# Patient Record
Sex: Female | Born: 1985 | Race: White | Hispanic: No | Marital: Married | Smoking: Never smoker
Health system: Southern US, Community
[De-identification: ages and names within clinical notes are randomized; demographics above are authoritative.]

## PROBLEM LIST (undated history)

## (undated) DIAGNOSIS — N912 Amenorrhea, unspecified: Secondary | ICD-10-CM

## (undated) DIAGNOSIS — Z87440 Personal history of urinary (tract) infections: Secondary | ICD-10-CM

## (undated) HISTORY — DX: Personal history of urinary (tract) infections: Z87.440

## (undated) HISTORY — DX: Amenorrhea, unspecified: N91.2

## (undated) HISTORY — PX: BREAST ENHANCEMENT SURGERY: SHX7

---

## 2008-01-30 ENCOUNTER — Ambulatory Visit: Payer: Self-pay | Admitting: Family Medicine

## 2009-03-17 ENCOUNTER — Observation Stay: Payer: Self-pay | Admitting: Obstetrics and Gynecology

## 2009-03-30 ENCOUNTER — Observation Stay: Payer: Self-pay

## 2009-03-31 ENCOUNTER — Inpatient Hospital Stay: Payer: Self-pay | Admitting: Obstetrics & Gynecology

## 2014-09-12 ENCOUNTER — Telehealth: Payer: Self-pay | Admitting: Obstetrics and Gynecology

## 2014-09-12 ENCOUNTER — Other Ambulatory Visit: Payer: Self-pay | Admitting: *Deleted

## 2014-09-12 MED ORDER — NORGESTIM-ETH ESTRAD TRIPHASIC 0.18/0.215/0.25 MG-35 MCG PO TABS: 1.0000 | ORAL_TABLET | Freq: Every day | ORAL | Status: DC

## 2014-09-12 NOTE — Telephone Encounter (Signed)
Patient called requesting a refill on her birth control.  She is scheduled for her annual 09/28/14. [facility] [place]. Thanks

## 2014-09-12 NOTE — Telephone Encounter (Signed)
Refilled for pt  

## 2014-09-26 ENCOUNTER — Encounter: Payer: Self-pay | Admitting: *Deleted

## 2014-09-28 ENCOUNTER — Other Ambulatory Visit: Payer: Self-pay | Admitting: Obstetrics and Gynecology

## 2014-09-28 ENCOUNTER — Encounter: Payer: Self-pay | Admitting: Obstetrics and Gynecology

## 2014-09-28 ENCOUNTER — Ambulatory Visit (INDEPENDENT_AMBULATORY_CARE_PROVIDER_SITE_OTHER): Payer: BLUE CROSS/BLUE SHIELD | Admitting: Obstetrics and Gynecology

## 2014-09-28 VITALS — BP 128/90 | HR 106 | Ht 62.0 in | Wt 123.0 lb

## 2014-09-28 DIAGNOSIS — Z01419 Encounter for gynecological examination (general) (routine) without abnormal findings: Secondary | ICD-10-CM | POA: Diagnosis not present

## 2014-09-28 MED ORDER — NORGESTIM-ETH ESTRAD TRIPHASIC 0.18/0.215/0.25 MG-35 MCG PO TABS
1.0000 | ORAL_TABLET | Freq: Every day | ORAL | Status: DC
Start: 2014-09-28 — End: 2016-02-28

## 2014-09-28 NOTE — Progress Notes (Signed)
  Subjective:     Jenny Mcguire is a 29 y.o. female and is here for a comprehensive physical exam. The patient reports no problems.  Social History   Social History  . Marital Status: Married    Spouse Name: N/A  . Number of Children: N/A  . Years of Education: N/A   Occupational History  . Not on file.   Social History Main Topics  . Smoking status: Never Smoker   . Smokeless tobacco: Never Used  . Alcohol Use: Yes     Comment: occas  . Drug Use: No  . Sexual Activity: Yes    Birth Control/ Protection: Pill   Other Topics Concern  . Not on file   Social History Narrative   Health Maintenance  Topic Date Due  . HIV Screening  11/26/2000  . TETANUS/TDAP  11/26/2004  . INFLUENZA VACCINE  08/29/2014    The following portions of the patient's history were reviewed and updated as appropriate: allergies, current medications, past family history, past medical history, past social history, past surgical history and problem list.  Review of Systems A comprehensive review of systems was negative.   Objective:    General appearance: alert, cooperative and appears stated age Neck: no adenopathy, no carotid bruit, no JVD, supple, symmetrical, trachea midline and thyroid not enlarged, symmetric, no tenderness/mass/nodules Lungs: clear to auscultation bilaterally Breasts: normal appearance, no masses or tenderness, bilateral implants without defect Heart: regular rate and rhythm, S1, S2 normal, no murmur, click, rub or gallop Abdomen: soft, non-tender; bowel sounds normal; no masses,  no organomegaly Pelvic: cervix normal in appearance, external genitalia normal, no adnexal masses or tenderness, no cervical motion tenderness, rectovaginal septum normal, uterus normal size, shape, and consistency and vagina normal without discharge    Assessment:    Healthy female exam. OCP user      Plan:  Pap obtained, will continue on current OCP   See After Visit Summary for  Counseling Recommendations

## 2014-09-28 NOTE — Patient Instructions (Signed)
  Place annual gynecologic exam patient instructions here.  Thank you for enrolling in MyChart. Please follow the instructions below to securely access your online medical record. MyChart allows you to send messages to your doctor, view your test results, manage appointments, and more.   How Do I Sign Up? 1. In your Internet browser, go to [employer] and enter [email or web address]. 2. Click on the Sign Up Now link in the Sign In box. You will see the New Member Sign Up page. 3. Enter your MyChart Access Code exactly as it appears below. You will not need to use this code after you've completed the sign-up process. If you do not sign up before the expiration date, you must request a new code.  MyChart Access Code: [email or web address] Expires: 11/27/2014 10:47 AM  4. Enter your Social Security Number ([id number]) and Date of Birth (mm/dd/yyyy) as indicated and click Submit. You will be taken to the next sign-up page. 5. Create a MyChart ID. This will be your MyChart login ID and cannot be changed, so think of one that is secure and easy to remember. 6. Create a MyChart password. You can change your password at any time. 7. Enter your Password Reset Question and Answer. This can be used at a later time if you forget your password.  8. Enter your e-mail address. You will receive e-mail notification when new information is available in MyChart. 9. Click Sign Up. You can now view your medical record.   Additional Information Remember, MyChart is NOT to be used for urgent needs. For medical emergencies, dial 911.

## 2014-09-29 LAB — CYTOLOGY - PAP

## 2014-10-04 ENCOUNTER — Telehealth: Payer: Self-pay | Admitting: *Deleted

## 2014-10-04 NOTE — Telephone Encounter (Signed)
Notified pt of normal results 

## 2014-10-04 NOTE — Telephone Encounter (Signed)
-----   Message from Ulyses Amor, [place] sent at 09/29/2014  3:57 PM EDT ----- Please let her know her pap was negative

## 2015-06-27 ENCOUNTER — Telehealth: Payer: Self-pay | Admitting: Obstetrics and Gynecology

## 2015-06-27 ENCOUNTER — Other Ambulatory Visit: Payer: Self-pay | Admitting: *Deleted

## 2015-06-27 MED ORDER — CIPROFLOXACIN HCL 500 MG PO TABS: 500.0000 mg | ORAL_TABLET | Freq: Two times a day (BID) | ORAL | Status: DC

## 2015-06-27 NOTE — Telephone Encounter (Signed)
Called pt she will drop off ua

## 2015-06-27 NOTE — Telephone Encounter (Signed)
Patient is positive that she has a UTI. Can something be called in for her or does she need to be seen? [facility] uses [facility] in [place]. Thanks

## 2015-10-03 ENCOUNTER — Encounter: Payer: BLUE CROSS/BLUE SHIELD | Admitting: Obstetrics and Gynecology

## 2015-10-06 ENCOUNTER — Encounter: Payer: BLUE CROSS/BLUE SHIELD | Admitting: Obstetrics and Gynecology

## 2015-10-11 ENCOUNTER — Encounter: Payer: Self-pay | Admitting: Obstetrics and Gynecology

## 2015-10-11 ENCOUNTER — Ambulatory Visit (INDEPENDENT_AMBULATORY_CARE_PROVIDER_SITE_OTHER): Payer: BLUE CROSS/BLUE SHIELD | Admitting: Obstetrics and Gynecology

## 2015-10-11 VITALS — BP 128/84 | HR 92 | Ht 62.0 in | Wt 123.6 lb

## 2015-10-11 DIAGNOSIS — N921 Excessive and frequent menstruation with irregular cycle: Secondary | ICD-10-CM | POA: Diagnosis not present

## 2015-10-11 DIAGNOSIS — N926 Irregular menstruation, unspecified: Secondary | ICD-10-CM

## 2015-10-11 DIAGNOSIS — Z01419 Encounter for gynecological examination (general) (routine) without abnormal findings: Secondary | ICD-10-CM | POA: Diagnosis not present

## 2015-10-11 LAB — POCT URINE PREGNANCY: Preg Test, Ur: NEGATIVE

## 2015-10-11 NOTE — Progress Notes (Signed)
   Subjective:     Jenny BergerLindsay R Mcguire is a 30 y.o. female and is here for a comprehensive physical exam. The patient reports BTB pretty heavy for 5 days after missing one pill this past month..  Social History   Social History  . Marital status: Married    Spouse name: N/A  . Number of children: N/A  . Years of education: N/A   Occupational History  . Not on file.   Social History Main Topics  . Smoking status: Never Smoker  . Smokeless tobacco: Never Used  . Alcohol use Yes     Comment: occas  . Drug use: No  . Sexual activity: Yes    Birth control/ protection: Pill   Other Topics Concern  . Not on file   Social History Narrative  . No narrative on file   Health Maintenance  Topic Date Due  . HIV Screening  11/26/2000  . TETANUS/TDAP  11/26/2004  . INFLUENZA VACCINE  08/29/2015  . PAP SMEAR  09/27/2017    The following portions of the patient's history were reviewed and updated as appropriate: allergies, current medications, past family history, past medical history, past social history, past surgical history and problem list.  Review of Systems Pertinent items noted in HPI and remainder of comprehensive ROS otherwise negative.   Objective:    General appearance: alert, cooperative and appears stated age Neck: no adenopathy, no carotid bruit, no JVD, supple, symmetrical, trachea midline and thyroid not enlarged, symmetric, no tenderness/mass/nodules Lungs: clear to auscultation bilaterally Breasts: normal appearance, no masses or tenderness Heart: regular rate and rhythm, S1, S2 normal, no murmur, click, rub or gallop Abdomen: soft, non-tender; bowel sounds normal; no masses,  no organomegaly Pelvic: cervix normal in appearance, external genitalia normal, no adnexal masses or tenderness, no cervical motion tenderness, rectovaginal septum normal, uterus normal size, shape, and consistency and vagina normal without discharge Extremities: extremities normal,  atraumatic, no cyanosis or edema   bilateral breast implants without defect noted UPT- Assessment:    Healthy female exam. BTB on OCP     Plan:  Reassured, desires pregnancy within next year, to add OTC PNV now. No other labs indicated.   See After Visit Summary for Counseling Recommendations

## 2015-10-11 NOTE — Patient Instructions (Signed)
Preventive Care for Adults, Female A healthy lifestyle and preventive care can promote health and wellness. Preventive health guidelines for women include the following key practices.  A routine yearly physical is a good way to check with your health care provider about your health and preventive screening. It is a chance to share any concerns and updates on your health and to receive a thorough exam.  Visit your dentist for a routine exam and preventive care every 6 months. Brush your teeth twice a day and floss once a day. Good oral hygiene prevents tooth decay and gum disease.  The frequency of eye exams is based on your age, health, family medical history, use of contact lenses, and other factors. Follow your health care provider's recommendations for frequency of eye exams.  Eat a healthy diet. Foods like vegetables, fruits, whole grains, low-fat dairy products, and lean protein foods contain the nutrients you need without too many calories. Decrease your intake of foods high in solid fats, added sugars, and salt. Eat the right amount of calories for you.Get information about a proper diet from your health care provider, if necessary.  Regular physical exercise is one of the most important things you can do for your health. Most adults should get at least 150 minutes of moderate-intensity exercise (any activity that increases your heart rate and causes you to sweat) each week. In addition, most adults need muscle-strengthening exercises on 2 or more days a week.  Maintain a healthy weight. The body mass index (BMI) is a screening tool to identify possible weight problems. It provides an estimate of body fat based on height and weight. Your health care provider can find your BMI and can help you achieve or maintain a healthy weight.For adults 20 years and older:  A BMI below 18.5 is considered underweight.  A BMI of 18.5 to 24.9 is normal.  A BMI of 25 to 29.9 is considered  overweight.  A BMI of 30 and above is considered obese.  Maintain normal blood lipids and cholesterol levels by exercising and minimizing your intake of saturated fat. Eat a balanced diet with plenty of fruit and vegetables. Blood tests for lipids and cholesterol should begin at age 64 and be repeated every 5 years. If your lipid or cholesterol levels are high, you are over 50, or you are at high risk for heart disease, you may need your cholesterol levels checked more frequently.Ongoing high lipid and cholesterol levels should be treated with medicines if diet and exercise are not working.  If you smoke, find out from your health care provider how to quit. If you do not use tobacco, do not start.  Lung cancer screening is recommended for adults aged 52-80 years who are at high risk for developing lung cancer because of a history of smoking. A yearly low-dose CT scan of the lungs is recommended for people who have at least a 30-pack-year history of smoking and are a current smoker or have quit within the past 15 years. A pack year of smoking is smoking an average of 1 pack of cigarettes a day for 1 year (for example: 1 pack a day for 30 years or 2 packs a day for 15 years). Yearly screening should continue until the smoker has stopped smoking for at least 15 years. Yearly screening should be stopped for people who develop a health problem that would prevent them from having lung cancer treatment.  If you are pregnant, do not drink alcohol. If you are  breastfeeding, be very cautious about drinking alcohol. If you are not pregnant and choose to drink alcohol, do not have more than 1 drink per day. One drink is considered to be 12 ounces (355 mL) of beer, 5 ounces (148 mL) of wine, or 1.5 ounces (44 mL) of liquor.  Avoid use of street drugs. Do not share needles with anyone. Ask for help if you need support or instructions about stopping the use of drugs.  High blood pressure causes heart disease and  increases the risk of stroke. Your blood pressure should be checked at least every 1 to 2 years. Ongoing high blood pressure should be treated with medicines if weight loss and exercise do not work.  If you are 25-78 years old, ask your health care provider if you should take aspirin to prevent strokes.  Diabetes screening is done by taking a blood sample to check your blood glucose level after you have not eaten for a certain period of time (fasting). If you are not overweight and you do not have risk factors for diabetes, you should be screened once every 3 years starting at age 86. If you are overweight or obese and you are 3-87 years of age, you should be screened for diabetes every year as part of your cardiovascular risk assessment.  Breast cancer screening is essential preventive care for women. You should practice "breast self-awareness." This means understanding the normal appearance and feel of your breasts and may include breast self-examination. Any changes detected, no matter how small, should be reported to a health care provider. Women in their 66s and 30s should have a clinical breast exam (CBE) by a health care provider as part of a regular health exam every 1 to 3 years. After age 43, women should have a CBE every year. Starting at age 37, women should consider having a mammogram (breast X-ray test) every year. Women who have a family history of breast cancer should talk to their health care provider about genetic screening. Women at a high risk of breast cancer should talk to their health care providers about having an MRI and a mammogram every year.  Breast cancer gene (BRCA)-related cancer risk assessment is recommended for women who have family members with BRCA-related cancers. BRCA-related cancers include breast, ovarian, tubal, and peritoneal cancers. Having family members with these cancers may be associated with an increased risk for harmful changes (mutations) in the breast  cancer genes BRCA1 and BRCA2. Results of the assessment will determine the need for genetic counseling and BRCA1 and BRCA2 testing.  Your health care provider may recommend that you be screened regularly for cancer of the pelvic organs (ovaries, uterus, and vagina). This screening involves a pelvic examination, including checking for microscopic changes to the surface of your cervix (Pap test). You may be encouraged to have this screening done every 3 years, beginning at age 78.  For women ages 79-65, health care providers may recommend pelvic exams and Pap testing every 3 years, or they may recommend the Pap and pelvic exam, combined with testing for human papilloma virus (HPV), every 5 years. Some types of HPV increase your risk of cervical cancer. Testing for HPV may also be done on women of any age with unclear Pap test results.  Other health care providers may not recommend any screening for nonpregnant women who are considered low risk for pelvic cancer and who do not have symptoms. Ask your health care provider if a screening pelvic exam is right for  you.  If you have had past treatment for cervical cancer or a condition that could lead to cancer, you need Pap tests and screening for cancer for at least 20 years after your treatment. If Pap tests have been discontinued, your risk factors (such as having a new sexual partner) need to be reassessed to determine if screening should resume. Some women have medical problems that increase the chance of getting cervical cancer. In these cases, your health care provider may recommend more frequent screening and Pap tests.  Colorectal cancer can be detected and often prevented. Most routine colorectal cancer screening begins at the age of 50 years and continues through age 75 years. However, your health care provider may recommend screening at an earlier age if you have risk factors for colon cancer. On a yearly basis, your health care provider may provide  home test kits to check for hidden blood in the stool. Use of a small camera at the end of a tube, to directly examine the colon (sigmoidoscopy or colonoscopy), can detect the earliest forms of colorectal cancer. Talk to your health care provider about this at age 50, when routine screening begins. Direct exam of the colon should be repeated every 5-10 years through age 75 years, unless early forms of precancerous polyps or small growths are found.  People who are at an increased risk for hepatitis B should be screened for this virus. You are considered at high risk for hepatitis B if:  You were born in a country where hepatitis B occurs often. Talk with your health care provider about which countries are considered high risk.  Your parents were born in a high-risk country and you have not received a shot to protect against hepatitis B (hepatitis B vaccine).  You have HIV or AIDS.  You use needles to inject street drugs.  You live with, or have sex with, someone who has hepatitis B.  You get hemodialysis treatment.  You take certain medicines for conditions like cancer, organ transplantation, and autoimmune conditions.  Hepatitis C blood testing is recommended for all people born from 1945 through 1965 and any individual with known risks for hepatitis C.  Practice safe sex. Use condoms and avoid high-risk sexual practices to reduce the spread of sexually transmitted infections (STIs). STIs include gonorrhea, chlamydia, syphilis, trichomonas, herpes, HPV, and human immunodeficiency virus (HIV). Herpes, HIV, and HPV are viral illnesses that have no cure. They can result in disability, cancer, and death.  You should be screened for sexually transmitted illnesses (STIs) including gonorrhea and chlamydia if:  You are sexually active and are younger than 24 years.  You are older than 24 years and your health care provider tells you that you are at risk for this type of infection.  Your sexual  activity has changed since you were last screened and you are at an increased risk for chlamydia or gonorrhea. Ask your health care provider if you are at risk.  If you are at risk of being infected with HIV, it is recommended that you take a prescription medicine daily to prevent HIV infection. This is called preexposure prophylaxis (PrEP). You are considered at risk if:  You are sexually active and do not regularly use condoms or know the HIV status of your partner(s).  You take drugs by injection.  You are sexually active with a partner who has HIV.  Talk with your health care provider about whether you are at high risk of being infected with HIV. If   you choose to begin PrEP, you should first be tested for HIV. You should then be tested every 3 months for as long as you are taking PrEP.  Osteoporosis is a disease in which the bones lose minerals and strength with aging. This can result in serious bone fractures or breaks. The risk of osteoporosis can be identified using a bone density scan. Women ages 1 years and over and women at risk for fractures or osteoporosis should discuss screening with their health care providers. Ask your health care provider whether you should take a calcium supplement or vitamin D to reduce the rate of osteoporosis.  Menopause can be associated with physical symptoms and risks. Hormone replacement therapy is available to decrease symptoms and risks. You should talk to your health care provider about whether hormone replacement therapy is right for you.  Use sunscreen. Apply sunscreen liberally and repeatedly throughout the day. You should seek shade when your shadow is shorter than you. Protect yourself by wearing long sleeves, pants, a wide-brimmed hat, and sunglasses year round, whenever you are outdoors.  Once a month, do a whole body skin exam, using a mirror to look at the skin on your back. Tell your health care provider of new moles, moles that have irregular  borders, moles that are larger than a pencil eraser, or moles that have changed in shape or color.  Stay current with required vaccines (immunizations).  Influenza vaccine. All adults should be immunized every year.  Tetanus, diphtheria, and acellular pertussis (Td, Tdap) vaccine. Pregnant women should receive 1 dose of Tdap vaccine during each pregnancy. The dose should be obtained regardless of the length of time since the last dose. Immunization is preferred during the 27th-36th week of gestation. An adult who has not previously received Tdap or who does not know her vaccine status should receive 1 dose of Tdap. This initial dose should be followed by tetanus and diphtheria toxoids (Td) booster doses every 10 years. Adults with an unknown or incomplete history of completing a 3-dose immunization series with Td-containing vaccines should begin or complete a primary immunization series including a Tdap dose. Adults should receive a Td booster every 10 years.  Varicella vaccine. An adult without evidence of immunity to varicella should receive 2 doses or a second dose if she has previously received 1 dose. Pregnant females who do not have evidence of immunity should receive the first dose after pregnancy. This first dose should be obtained before leaving the health care facility. The second dose should be obtained 4-8 weeks after the first dose.  Human papillomavirus (HPV) vaccine. Females aged 13-26 years who have not received the vaccine previously should obtain the 3-dose series. The vaccine is not recommended for use in pregnant females. However, pregnancy testing is not needed before receiving a dose. If a female is found to be pregnant after receiving a dose, no treatment is needed. In that case, the remaining doses should be delayed until after the pregnancy. Immunization is recommended for any person with an immunocompromised condition through the age of 24 years if she did not get any or all doses  earlier. During the 3-dose series, the second dose should be obtained 4-8 weeks after the first dose. The third dose should be obtained 24 weeks after the first dose and 16 weeks after the second dose.  Zoster vaccine. One dose is recommended for adults aged 97 years or older unless certain conditions are present.  Measles, mumps, and rubella (MMR) vaccine. Adults born  before 1957 generally are considered immune to measles and mumps. Adults born in [birth year] or later should have 1 or more doses of MMR vaccine unless there is a contraindication to the vaccine or there is laboratory evidence of immunity to each of the three diseases. A routine second dose of MMR vaccine should be obtained at least 28 days after the first dose for students attending postsecondary schools, health care workers, or international travelers. People who received inactivated measles vaccine or an unknown type of measles vaccine during 1963-1967 should receive 2 doses of MMR vaccine. People who received inactivated mumps vaccine or an unknown type of mumps vaccine before 1979 and are at high risk for mumps infection should consider immunization with 2 doses of MMR vaccine. For females of childbearing age, rubella immunity should be determined. If there is no evidence of immunity, females who are not pregnant should be vaccinated. If there is no evidence of immunity, females who are pregnant should delay immunization until after pregnancy. Unvaccinated health care workers born before 60 who lack laboratory evidence of measles, mumps, or rubella immunity or laboratory confirmation of disease should consider measles and mumps immunization with 2 doses of MMR vaccine or rubella immunization with 1 dose of MMR vaccine.  Pneumococcal 13-valent conjugate (PCV13) vaccine. When indicated, a person who is uncertain of his immunization history and has no record of immunization should receive the PCV13 vaccine. All adults 61 years of age and older  should receive this vaccine. An adult aged 92 years or older who has certain medical conditions and has not been previously immunized should receive 1 dose of PCV13 vaccine. This PCV13 should be followed with a dose of pneumococcal polysaccharide (PPSV23) vaccine. Adults who are at high risk for pneumococcal disease should obtain the PPSV23 vaccine at least 8 weeks after the dose of PCV13 vaccine. Adults older than 30 years of age who have normal immune system function should obtain the PPSV23 vaccine dose at least 1 year after the dose of PCV13 vaccine.  Pneumococcal polysaccharide (PPSV23) vaccine. When PCV13 is also indicated, PCV13 should be obtained first. All adults aged 2 years and older should be immunized. An adult younger than age 30 years who has certain medical conditions should be immunized. Any person who resides in a nursing home or long-term care facility should be immunized. An adult smoker should be immunized. People with an immunocompromised condition and certain other conditions should receive both PCV13 and PPSV23 vaccines. People with human immunodeficiency virus (HIV) infection should be immunized as soon as possible after diagnosis. Immunization during chemotherapy or radiation therapy should be avoided. Routine use of PPSV23 vaccine is not recommended for American Indians, [place] Natives, or people younger than 65 years unless there are medical conditions that require PPSV23 vaccine. When indicated, people who have unknown immunization and have no record of immunization should receive PPSV23 vaccine. One-time revaccination 5 years after the first dose of PPSV23 is recommended for people aged 19-64 years who have chronic kidney failure, nephrotic syndrome, asplenia, or immunocompromised conditions. People who received 1-2 doses of PPSV23 before age 44 years should receive another dose of PPSV23 vaccine at age 83 years or later if at least 5 years have passed since the previous dose. Doses  of PPSV23 are not needed for people immunized with PPSV23 at or after age 20 years.  Meningococcal vaccine. Adults with asplenia or persistent complement component deficiencies should receive 2 doses of quadrivalent meningococcal conjugate (MenACWY-D) vaccine. The doses should be obtained  at least 2 months apart. Microbiologists working with certain meningococcal bacteria, [place] recruits, people at risk during an outbreak, and people who travel to or live in countries with a high rate of meningitis should be immunized. A first-year college student up through age 28 years who is living in a residence hall should receive a dose if she did not receive a dose on or after her 16th birthday. Adults who have certain high-risk conditions should receive one or more doses of vaccine.  Hepatitis A vaccine. Adults who wish to be protected from this disease, have certain high-risk conditions, work with hepatitis A-infected animals, work in hepatitis A research labs, or travel to or work in countries with a high rate of hepatitis A should be immunized. Adults who were previously unvaccinated and who anticipate close contact with an international adoptee during the first 60 days after arrival in the [place] from a country with a high rate of hepatitis A should be immunized.  Hepatitis B vaccine. Adults who wish to be protected from this disease, have certain high-risk conditions, may be exposed to blood or other infectious body fluids, are household contacts or sex partners of hepatitis B positive people, are clients or workers in certain care facilities, or travel to or work in countries with a high rate of hepatitis B should be immunized.  Haemophilus influenzae type b (Hib) vaccine. A previously unvaccinated person with asplenia or sickle cell disease or having a scheduled splenectomy should receive 1 dose of Hib vaccine. Regardless of previous immunization, a recipient of a hematopoietic stem cell transplant  should receive a 3-dose series 6-12 months after her successful transplant. Hib vaccine is not recommended for adults with HIV infection. Preventive Services / Frequency Ages 71 to 87 years  Blood pressure check.** / Every 3-5 years.  Lipid and cholesterol check.** / Every 5 years beginning at age 1.  Clinical breast exam.** / Every 3 years for women in their 3s and 31s.  BRCA-related cancer risk assessment.** / For women who have family members with a BRCA-related cancer (breast, ovarian, tubal, or peritoneal cancers).  Pap test.** / Every 2 years from ages 50 through 86. Every 3 years starting at age 87 through age 7 or 75 with a history of 3 consecutive normal Pap tests.  HPV screening.** / Every 3 years from ages 59 through ages 35 to 6 with a history of 3 consecutive normal Pap tests.  Hepatitis C blood test.** / For any individual with known risks for hepatitis C.  Skin self-exam. / Monthly.  Influenza vaccine. / Every year.  Tetanus, diphtheria, and acellular pertussis (Tdap, Td) vaccine.** / Consult your health care provider. Pregnant women should receive 1 dose of Tdap vaccine during each pregnancy. 1 dose of Td every 10 years.  Varicella vaccine.** / Consult your health care provider. Pregnant females who do not have evidence of immunity should receive the first dose after pregnancy.  HPV vaccine. / 3 doses over 6 months, if 72 and younger. The vaccine is not recommended for use in pregnant females. However, pregnancy testing is not needed before receiving a dose.  Measles, mumps, rubella (MMR) vaccine.** / You need at least 1 dose of MMR if you were born in 1957 or later. You may also need a 2nd dose. For females of childbearing age, rubella immunity should be determined. If there is no evidence of immunity, females who are not pregnant should be vaccinated. If there is no evidence of immunity, females who are  pregnant should delay immunization until after  pregnancy.  Pneumococcal 13-valent conjugate (PCV13) vaccine.** / Consult your health care provider.  Pneumococcal polysaccharide (PPSV23) vaccine.** / 1 to 2 doses if you smoke cigarettes or if you have certain conditions.  Meningococcal vaccine.** / 1 dose if you are age 87 to 44 years and a Market researcher living in a residence hall, or have one of several medical conditions, you need to get vaccinated against meningococcal disease. You may also need additional booster doses.  Hepatitis A vaccine.** / Consult your health care provider.  Hepatitis B vaccine.** / Consult your health care provider.  Haemophilus influenzae type b (Hib) vaccine.** / Consult your health care provider. Ages 86 to 38 years  Blood pressure check.** / Every year.  Lipid and cholesterol check.** / Every 5 years beginning at age 49 years.  Lung cancer screening. / Every year if you are aged 71-80 years and have a 30-pack-year history of smoking and currently smoke or have quit within the past 15 years. Yearly screening is stopped once you have quit smoking for at least 15 years or develop a health problem that would prevent you from having lung cancer treatment.  Clinical breast exam.** / Every year after age 51 years.  BRCA-related cancer risk assessment.** / For women who have family members with a BRCA-related cancer (breast, ovarian, tubal, or peritoneal cancers).  Mammogram.** / Every year beginning at age 18 years and continuing for as long as you are in good health. Consult with your health care provider.  Pap test.** / Every 3 years starting at age 63 years through age 37 or 57 years with a history of 3 consecutive normal Pap tests.  HPV screening.** / Every 3 years from ages 41 years through ages 76 to 23 years with a history of 3 consecutive normal Pap tests.  Fecal occult blood test (FOBT) of stool. / Every year beginning at age 36 years and continuing until age 51 years. You may not need  to do this test if you get a colonoscopy every 10 years.  Flexible sigmoidoscopy or colonoscopy.** / Every 5 years for a flexible sigmoidoscopy or every 10 years for a colonoscopy beginning at age 36 years and continuing until age 35 years.  Hepatitis C blood test.** / For all people born from [birth year] through 1965 and any individual with known risks for hepatitis C.  Skin self-exam. / Monthly.  Influenza vaccine. / Every year.  Tetanus, diphtheria, and acellular pertussis (Tdap/Td) vaccine.** / Consult your health care provider. Pregnant women should receive 1 dose of Tdap vaccine during each pregnancy. 1 dose of Td every 10 years.  Varicella vaccine.** / Consult your health care provider. Pregnant females who do not have evidence of immunity should receive the first dose after pregnancy.  Zoster vaccine.** / 1 dose for adults aged 73 years or older.  Measles, mumps, rubella (MMR) vaccine.** / You need at least 1 dose of MMR if you were born in 1957 or later. You may also need a second dose. For females of childbearing age, rubella immunity should be determined. If there is no evidence of immunity, females who are not pregnant should be vaccinated. If there is no evidence of immunity, females who are pregnant should delay immunization until after pregnancy.  Pneumococcal 13-valent conjugate (PCV13) vaccine.** / Consult your health care provider.  Pneumococcal polysaccharide (PPSV23) vaccine.** / 1 to 2 doses if you smoke cigarettes or if you have certain conditions.  Meningococcal vaccine.** /  Consult your health care provider.  Hepatitis A vaccine.** / Consult your health care provider.  Hepatitis B vaccine.** / Consult your health care provider.  Haemophilus influenzae type b (Hib) vaccine.** / Consult your health care provider. Ages 80 years and over  Blood pressure check.** / Every year.  Lipid and cholesterol check.** / Every 5 years beginning at age 62 years.  Lung cancer  screening. / Every year if you are aged 32-80 years and have a 30-pack-year history of smoking and currently smoke or have quit within the past 15 years. Yearly screening is stopped once you have quit smoking for at least 15 years or develop a health problem that would prevent you from having lung cancer treatment.  Clinical breast exam.** / Every year after age 61 years.  BRCA-related cancer risk assessment.** / For women who have family members with a BRCA-related cancer (breast, ovarian, tubal, or peritoneal cancers).  Mammogram.** / Every year beginning at age 39 years and continuing for as long as you are in good health. Consult with your health care provider.  Pap test.** / Every 3 years starting at age 85 years through age 74 or 72 years with 3 consecutive normal Pap tests. Testing can be stopped between 65 and 70 years with 3 consecutive normal Pap tests and no abnormal Pap or HPV tests in the past 10 years.  HPV screening.** / Every 3 years from ages 55 years through ages 67 or 77 years with a history of 3 consecutive normal Pap tests. Testing can be stopped between 65 and 70 years with 3 consecutive normal Pap tests and no abnormal Pap or HPV tests in the past 10 years.  Fecal occult blood test (FOBT) of stool. / Every year beginning at age 81 years and continuing until age 22 years. You may not need to do this test if you get a colonoscopy every 10 years.  Flexible sigmoidoscopy or colonoscopy.** / Every 5 years for a flexible sigmoidoscopy or every 10 years for a colonoscopy beginning at age 67 years and continuing until age 22 years.  Hepatitis C blood test.** / For all people born from [birth year] through 1965 and any individual with known risks for hepatitis C.  Osteoporosis screening.** / A one-time screening for women ages 8 years and over and women at risk for fractures or osteoporosis.  Skin self-exam. / Monthly.  Influenza vaccine. / Every year.  Tetanus, diphtheria, and  acellular pertussis (Tdap/Td) vaccine.** / 1 dose of Td every 10 years.  Varicella vaccine.** / Consult your health care provider.  Zoster vaccine.** / 1 dose for adults aged 56 years or older.  Pneumococcal 13-valent conjugate (PCV13) vaccine.** / Consult your health care provider.  Pneumococcal polysaccharide (PPSV23) vaccine.** / 1 dose for all adults aged 15 years and older.  Meningococcal vaccine.** / Consult your health care provider.  Hepatitis A vaccine.** / Consult your health care provider.  Hepatitis B vaccine.** / Consult your health care provider.  Haemophilus influenzae type b (Hib) vaccine.** / Consult your health care provider. ** Family history and personal history of risk and conditions may change your health care provider's recommendations.   This information is not intended to replace advice given to you by your health care provider. Make sure you discuss any questions you have with your health care provider.   Document Released: 03/12/2001 Document Revised: 02/04/2014 Document Reviewed: 06/11/2010 Elsevier Interactive Patient Education [employer].

## 2015-10-16 ENCOUNTER — Encounter: Payer: Self-pay | Admitting: Obstetrics and Gynecology

## 2015-10-18 ENCOUNTER — Telehealth: Payer: Self-pay | Admitting: Obstetrics and Gynecology

## 2015-10-18 NOTE — Telephone Encounter (Signed)
Pt called yesterday and she said that she wanted to make sure you have gotten her My Chart messages, she wasn't sure if they were going thru or not. She stated she had never done the my chart message before and wanted to make sure she was doing it right.

## 2015-11-10 ENCOUNTER — Encounter: Payer: Self-pay | Admitting: Obstetrics and Gynecology

## 2015-11-14 ENCOUNTER — Encounter: Payer: Self-pay | Admitting: Obstetrics and Gynecology

## 2015-11-30 ENCOUNTER — Encounter: Payer: BLUE CROSS/BLUE SHIELD | Admitting: Obstetrics and Gynecology

## 2015-12-12 ENCOUNTER — Encounter: Payer: Self-pay | Admitting: Obstetrics and Gynecology

## 2015-12-13 ENCOUNTER — Encounter: Payer: Self-pay | Admitting: Obstetrics and Gynecology

## 2016-01-28 ENCOUNTER — Encounter: Payer: Self-pay | Admitting: Obstetrics and Gynecology

## 2016-01-29 NOTE — L&D Delivery Note (Signed)
      Delivery Note   Jenny BergerLindsay R Mcguire is a 31 y.o. G2P1001 at 5267w2d Estimated Date of Delivery: 10/11/16  PRE-OPERATIVE DIAGNOSIS:  1) 5067w2d pregnancy.   POST-OPERATIVE DIAGNOSIS:  1) 2467w2d pregnancy s/p Vaginal, Spontaneous Delivery   Delivery Type: Vaginal, Spontaneous Delivery    Delivery Clinician: Doreene BurkeHOMPSON, Jaley Yan   Delivery Anesthesia: Epidural   Labor Complications:   None    ESTIMATED BLOOD LOSS: 150 ml    FINDINGS:   1) female infant, Apgar scores of 8    at 1 minute and 8    at 5 minutes and a birthweight of    ounces.    2) Nuchal cord: No  SPECIMENS:   PLACENTA:   Appearance: Intact , 3 vessel cord, cord blood collected   Removal: Spontaneous      Disposition:   Help per protocol then discarded  DISPOSITION:  Infant to left in stable condition in the delivery room, with L&D personnel and mother,  NARRATIVE SUMMARY: Labor course:  Ms. Jenny Mcguire is a G2P1001 at 5867w2d who presented for labor management.  She progressed well in labor with pitocin.  She received the appropriate Epidural anesthesia and proceeded to complete dilation. She evidenced good maternal expulsive effort during the second stage. She went on to deliver a viable female infant "Jenny Mcguire". The placenta delivered without problems and was noted to be complete. A perineal and vaginal examination was performed. Episiotomy/Lacerations:    None. The patient tolerated this well.  Doreene Burkennie Shelsey Rieth, CNM 09/29/2016 1:25 PM

## 2016-02-01 ENCOUNTER — Encounter: Payer: Self-pay | Admitting: Obstetrics and Gynecology

## 2016-02-01 ENCOUNTER — Ambulatory Visit (INDEPENDENT_AMBULATORY_CARE_PROVIDER_SITE_OTHER): Payer: 59 | Admitting: Obstetrics and Gynecology

## 2016-02-01 VITALS — BP 120/81 | HR 101 | Ht 62.0 in | Wt 125.6 lb

## 2016-02-01 DIAGNOSIS — N926 Irregular menstruation, unspecified: Secondary | ICD-10-CM

## 2016-02-01 LAB — POCT URINE PREGNANCY: Preg Test, Ur: POSITIVE — AB

## 2016-02-01 NOTE — Progress Notes (Signed)
Subjective:     Patient ID: Jenny Mcguire, female   DOB: 1985/03/06, 31 y.o.   MRN: [id number]  HPI LMP 01/05/16, [facility] 6464[facility]6d[facility] Denies any symptoms. Happy about planned pregnancy  Review of Systems Negative except missed menses    Objective:   Physical Exam A&Ox4 Well groomed female in no distress Blood pressure 120/81, pulse (!) 101, height 5\' 2"  (1.575 m), weight 125 lb 9.6 oz (57 kg), last menstrual period 01/05/2016. UPT+    Assessment:     Missed menses    Plan:     Will return at 7 weeks for viability scan and nurse intake/labs Also New OB at 11 weeks.  Toni Demo [place], CNM

## 2016-02-02 ENCOUNTER — Encounter: Payer: Self-pay | Admitting: Obstetrics and Gynecology

## 2016-02-11 ENCOUNTER — Encounter: Payer: Self-pay | Admitting: Obstetrics and Gynecology

## 2016-02-21 ENCOUNTER — Encounter: Payer: Self-pay | Admitting: Obstetrics and Gynecology

## 2016-02-28 ENCOUNTER — Ambulatory Visit (INDEPENDENT_AMBULATORY_CARE_PROVIDER_SITE_OTHER): Payer: 59 | Admitting: Obstetrics and Gynecology

## 2016-02-28 ENCOUNTER — Ambulatory Visit (INDEPENDENT_AMBULATORY_CARE_PROVIDER_SITE_OTHER): Payer: 59

## 2016-02-28 VITALS — BP 104/79 | HR 88 | Ht 62.0 in | Wt 124.5 lb

## 2016-02-28 DIAGNOSIS — N926 Irregular menstruation, unspecified: Secondary | ICD-10-CM

## 2016-02-28 DIAGNOSIS — Z1389 Encounter for screening for other disorder: Secondary | ICD-10-CM

## 2016-02-28 DIAGNOSIS — Z113 Encounter for screening for infections with a predominantly sexual mode of transmission: Secondary | ICD-10-CM

## 2016-02-28 DIAGNOSIS — Z3481 Encounter for supervision of other normal pregnancy, first trimester: Secondary | ICD-10-CM

## 2016-02-28 LAB — OB RESULTS CONSOLE VARICELLA ZOSTER ANTIBODY, IGG: Varicella: IMMUNE

## 2016-02-28 NOTE — Progress Notes (Signed)
Jenny BergerLindsay R Mcguire presents for NOB nurse interview visit. Pregnancy confirmation done 02/01/2016. UPT: POSITIVE.  G-2  P-1001.  Ultrasound done today. GA: 7.6 wks, consistent with LMP: 01/05/2016. Pregnancy education material explained and given.  No cats in the home. NOB labs ordered.  HIV labs and Drug screen were explained optional and she did not decline. Drug screen ordered. PNV encouraged. Pt desires the [facility] genetic testing and will do on her NOB physical.  Pt. To follow up with provider on 03/20/2016 for NOB physical, which is already scheduled.   All questions answered.

## 2016-02-28 NOTE — Patient Instructions (Signed)
Pregnancy and Zika Virus Disease Introduction Zika virus disease, or Zika, is an illness that can spread to people from mosquitoes that carry the virus. It may also spread from person to person through infected body fluids. Zika first occurred in [place], but recently it has spread to new areas. The virus occurs in tropical climates. The location of Zika continues to change. Most people who become infected with Zika virus do not develop serious illness. However, Zika may cause birth defects in an unborn baby whose mother is infected with the virus. It may also increase the risk of miscarriage. What are the symptoms of Zika virus disease? In many cases, people who have been infected with Zika virus do not develop any symptoms. If symptoms appear, they usually start about a week after the person is infected. Symptoms are usually mild. They may include:  Fever.  Rash.  Red eyes.  Joint pain. How does Zika virus disease spread? The main way that Zika virus spreads is through the bite of a certain type of mosquito. Unlike most types of mosquitos, which bite only at night, the type of mosquito that carries Zika virus bites both at night and during the day. Zika virus can also spread through sexual contact, through a blood transfusion, and from a mother to her baby before or during birth. Once you have had Zika virus disease, it is unlikely that you will get it again. Can I pass Zika to my baby during pregnancy? Yes, Zika can pass from a mother to her baby before or during birth. What problems can Zika cause for my baby? A woman who is infected with Zika virus while pregnant is at risk of having her baby born with a condition in which the brain or head is smaller than expected (microcephaly). Babies who have microcephaly can have developmental delays, seizures, hearing problems, and vision problems. Having Zika virus disease during pregnancy can also increase the risk of miscarriage. How can Zika  virus disease be prevented? There is no vaccine to prevent Zika. The best way to prevent the disease is to avoid infected mosquitoes and avoid exposure to body fluids that can spread the virus. Avoid any possible exposure to Zika by taking the following precautions. For women and their sex partners:  Avoid traveling to high-risk areas. The locations where Zika is being reported change often. To identify high-risk areas, check the CDC travel website: www.cdc.gov/zika/geo/index.html  If you or your sex partner must travel to a high-risk area, talk with a health care provider before and after traveling.  Take all precautions to avoid mosquito bites if you live in, or travel to, any of the high-risk areas. Insect repellents are safe to use during pregnancy.  Ask your health care provider when it is safe to have sexual contact. For women:  If you are pregnant or trying to become pregnant, avoid sexual contact with persons who may have been exposed to Zika virus, persons who have possible symptoms of Zika, or persons whose history you are unsure about. If you choose to have sexual contact with someone who may have been exposed to Zika virus, use condoms correctly during the entire duration of sexual activity, every time. Do not share sexual devices, as you may be exposed to body fluids.  Ask your health care provider about when it is safe to attempt pregnancy after a possible exposure to Zika virus. What steps should I take to avoid mosquito bites? Take these steps to avoid mosquito bites when you   are in a high-risk area:  Wear loose clothing that covers your arms and legs.  Limit your outdoor activities.  Do not open windows unless they have window screens.  Sleep under mosquito nets.  Use insect repellent. The best insect repellents have:  DEET, picaridin, oil of lemon eucalyptus (OLE), or IR3535 in them.  Higher amounts of an active ingredient in them.  Remember that insect repellents  are safe to use during pregnancy.  Do not use OLE on children who are younger than 3 years of age. Do not use insect repellent on babies who are younger than 2 months of age.  Cover your child's stroller with mosquito netting. Make sure the netting fits snugly and that any loose netting does not cover your child's mouth or nose. Do not use a blanket as a mosquito-protection cover.  Do not apply insect repellent underneath clothing.  If you are using sunscreen, apply the sunscreen before applying the insect repellent.  Treat clothing with permethrin. Do not apply permethrin directly to your skin. Follow label directions for safe use.  Get rid of standing water, where mosquitoes may reproduce. Standing water is often found in items such as buckets, bowls, animal food dishes, and flowerpots. When you return from traveling to any high-risk area, continue taking actions to protect yourself against mosquito bites for 3 weeks, even if you show no signs of illness. This will prevent spreading Zika virus to uninfected mosquitoes. What should I know about the sexual transmission of Zika? People can spread Zika to their sexual partners during vaginal, anal, or oral sex, or by sharing sexual devices. Many people with Zika do not develop symptoms, so a person could spread the disease without knowing that they are infected. The greatest risk is to women who are pregnant or who may become pregnant. Zika virus can live longer in semen than it can live in blood. Couples can prevent sexual transmission of the virus by:  Using condoms correctly during the entire duration of sexual activity, every time. This includes vaginal, anal, and oral sex.  Not sharing sexual devices. Sharing increases your risk of being exposed to body fluid from another person.  Avoiding all sexual activity until your health care provider says it is safe. Should I be tested for Zika virus? A sample of your blood can be tested for Zika  virus. A pregnant woman should be tested if she may have been exposed to the virus or if she has symptoms of Zika. She may also have additional tests done during her pregnancy, such ultrasound testing. Talk with your health care provider about which tests are recommended. This information is not intended to replace advice given to you by your health care provider. Make sure you discuss any questions you have with your health care provider. Document Released: 10/05/2014 Document Revised: 06/22/2015 Document Reviewed: 09/28/2014  2017 Elsevier Minor Illnesses and Medications in Pregnancy  Cold/Flu:  Sudafed for congestion- Robitussin (plain) for cough- Tylenol for discomfort.  Please follow the directions on the label.  Try not to take any more than needed.  OTC Saline nasal spray and air humidifier or cool-mist  Vaporizer to sooth nasal irritation and to loosen congestion.  It is also important to increase intake of non carbonated fluids, especially if you have a fever.  Constipation:  Colace-2 capsules at bedtime; Metamucil- follow directions on label; Senokot- 1 tablet at bedtime.  Any one of these medications can be used.  It is also very important to increase   fluids and fruits along with regular exercise.  If problem persists please call the office.  Diarrhea:  Kaopectate as directed on the label.  Eat a bland diet and increase fluids.  Avoid highly seasoned foods.  Headache:  Tylenol 1 or 2 tablets every 3-4 hours as needed  Indigestion:  Maalox, Mylanta, Tums or Rolaids- as directed on label.  Also try to eat small meals and avoid fatty, greasy or spicy foods.  Nausea with or without Vomiting:  Nausea in pregnancy is caused by increased levels of hormones in the body which influence the digestive system and cause irritation when stomach acids accumulate.  Symptoms usually subside after 1st trimester of pregnancy.  Try the following: 1. Keep saltines, graham crackers or dry toast by your bed to  eat upon awakening. 2. Don't let your stomach get empty.  Try to eat 5-6 small meals per day instead of 3 large ones. 3. Avoid greasy fatty or highly seasoned foods.  4. Take OTC Unisom 1 tablet at bed time along with OTC Vitamin B6 25-50 mg 3 times per day.    If nausea continues with vomiting and you are unable to keep down food and fluids you may need a prescription medication.  Please notify your provider.   Sore throat:  Chloraseptic spray, throat lozenges and or plain Tylenol.  Vaginal Yeast Infection:  OTC Monistat for 7 days as directed on label.  If symptoms do not resolve within a week notify provider.  If any of the above problems do not subside with recommended treatment please call the office for further assistance.   Do not take Aspirin, Advil, Motrin or Ibuprofen.  * * OTC= Over the counter Hyperemesis Gravidarum Hyperemesis gravidarum is a severe form of nausea and vomiting that happens during pregnancy. Hyperemesis is worse than morning sickness. It may cause you to have nausea or vomiting all day for many days. It may keep you from eating and drinking enough food and liquids. Hyperemesis usually occurs during the first half (the first 20 weeks) of pregnancy. It often goes away once a woman is in her second half of pregnancy. However, sometimes hyperemesis continues through an entire pregnancy. What are the causes? The cause of this condition is not known. It may be related to changes in chemicals (hormones) in the body during pregnancy, such as the high level of pregnancy hormone (human chorionic gonadotropin) or the increase in the female sex hormone (estrogen). What are the signs or symptoms? Symptoms of this condition include:  Severe nausea and vomiting.  Nausea that does not go away.  Vomiting that does not allow you to keep any food down.  Weight loss.  Body fluid loss (dehydration).  Having no desire to eat, or not liking food that you have previously  enjoyed. How is this diagnosed? This condition may be diagnosed based on:  A physical exam.  Your medical history.  Your symptoms.  Blood tests.  Urine tests. How is this treated? This condition may be managed with medicine. If medicines to do not help relieve nausea and vomiting, you may need to receive fluids through an IV tube at the hospital. Follow these instructions at home:  Take over-the-counter and prescription medicines only as told by your health care provider.  Avoid iron pills and multivitamins that contain iron for the first 3-4 months of pregnancy. If you take prescription iron pills, do not stop taking them unless your health care provider approves.  Take the following actions to help   prevent nausea and vomiting:  In the morning, before getting out of bed, try eating a couple of dry crackers or a piece of toast.  Avoid foods and smells that upset your stomach. Fatty and spicy foods may make nausea worse.  Eat 5-6 small meals a day.  Do not drink fluids while eating meals. Drink between meals.  Eat or suck on things that have ginger in them. Ginger can help relieve nausea.  Avoid food preparation. The smell of food can spoil your appetite or trigger nausea.  Follow instructions from your health care provider about eating or drinking restrictions.  For snacks, eat high-protein foods, such as cheese.  Keep all follow-up and pre-birth (prenatal) visits as told by your health care provider. This is important. Contact a health care provider if:  You have pain in your abdomen.  You have a severe headache.  You have vision problems.  You are losing weight. Get help right away if:  You cannot drink fluids without vomiting.  You vomit blood.  You have constant nausea and vomiting.  You are very weak.  You are very thirsty.  You feel dizzy.  You faint.  You have a fever or other symptoms that last for more than 2-3 days.  You have a fever and  your symptoms suddenly get worse. Summary  Hyperemesis gravidarum is a severe form of nausea and vomiting that happens during pregnancy.  Making some changes to your eating habits may help relieve nausea and vomiting.  This condition may be managed with medicine.  If medicines to do not help relieve nausea and vomiting, you may need to receive fluids through an IV tube at the hospital. This information is not intended to replace advice given to you by your health care provider. Make sure you discuss any questions you have with your health care provider. Document Released: 01/14/2005 Document Revised: 09/13/2015 Document Reviewed: 09/13/2015 Elsevier Interactive Patient Education  2017 [employer] First Trimester of Pregnancy The first trimester of pregnancy is from week 1 until the end of week 12 (months 1 through 3). During this time, your baby will begin to develop inside you. At 6-8 weeks, the eyes and face are formed, and the heartbeat can be seen on ultrasound. At the end of 12 weeks, all the baby's organs are formed. Prenatal care is all the medical care you receive before the birth of your baby. Make sure you get good prenatal care and follow all of your doctor's instructions. Follow these instructions at home: Medicines  Take medicine only as told by your doctor. Some medicines are safe and some are not during pregnancy.  Take your prenatal vitamins as told by your doctor.  Take medicine that helps you poop (stool softener) as needed if your doctor says it is okay. Diet  Eat regular, healthy meals.  Your doctor will tell you the amount of weight gain that is right for you.  Avoid raw meat and uncooked cheese.  If you feel sick to your stomach (nauseous) or throw up (vomit):  Eat 4 or 5 small meals a day instead of 3 large meals.  Try eating a few soda crackers.  Drink liquids between meals instead of during meals.  If you have a hard time pooping  (constipation):  Eat high-fiber foods like fresh vegetables, fruit, and whole grains.  Drink enough fluids to keep your pee (urine) clear or pale yellow. Activity and Exercise  Exercise only as told by your doctor. Stop exercising if   you have cramps or pain in your lower belly (abdomen) or low back.  Try to avoid standing for long periods of time. Move your legs often if you must stand in one place for a long time.  Avoid heavy lifting.  Wear low-heeled shoes. Sit and stand up straight.  You can have sex unless your doctor tells you not to. Relief of Pain or Discomfort  Wear a good support bra if your breasts are sore.  Take warm water baths (sitz baths) to soothe pain or discomfort caused by hemorrhoids. Use hemorrhoid cream if your doctor says it is okay.  Rest with your legs raised if you have leg cramps or low back pain.  Wear support hose if you have puffy, bulging veins (varicose veins) in your legs. Raise (elevate) your feet for 15 minutes, 3-4 times a day. Limit salt in your diet. Prenatal Care  Schedule your prenatal visits by the twelfth week of pregnancy.  Write down your questions. Take them to your prenatal visits.  Keep all your prenatal visits as told by your doctor. Safety  Wear your seat belt at all times when driving.  Make a list of emergency phone numbers. The list should include numbers for family, friends, the hospital, and police and fire departments. General Tips  Ask your doctor for a referral to a local prenatal class. Begin classes no later than at the start of month 6 of your pregnancy.  Ask for help if you need counseling or help with nutrition. Your doctor can give you advice or tell you where to go for help.  Do not use hot tubs, steam rooms, or saunas.  Do not douche or use tampons or scented sanitary pads.  Do not cross your legs for long periods of time.  Avoid litter boxes and soil used by cats.  Avoid all smoking, herbs, and  alcohol. Avoid drugs not approved by your doctor.  Do not use any tobacco products, including cigarettes, chewing tobacco, and electronic cigarettes. If you need help quitting, ask your doctor. You may get counseling or other support to help you quit.  Visit your dentist. At home, brush your teeth with a soft toothbrush. Be gentle when you floss. Get help if:  You are dizzy.  You have mild cramps or pressure in your lower belly.  You have a nagging pain in your belly area.  You continue to feel sick to your stomach, throw up, or have watery poop (diarrhea).  You have a bad smelling fluid coming from your vagina.  You have pain with peeing (urination).  You have increased puffiness (swelling) in your face, hands, legs, or ankles. Get help right away if:  You have a fever.  You are leaking fluid from your vagina.  You have spotting or bleeding from your vagina.  You have very bad belly cramping or pain.  You gain or lose weight rapidly.  You throw up blood. It may look like coffee grounds.  You are around people who have German measles, fifth disease, or chickenpox.  You have a very bad headache.  You have shortness of breath.  You have any kind of trauma, such as from a fall or a car accident. This information is not intended to replace advice given to you by your health care provider. Make sure you discuss any questions you have with your health care provider. Document Released: 07/03/2007 Document Revised: 06/22/2015 Document Reviewed: 11/24/2012 Elsevier Interactive Patient Education  2017 [employer] Commonly Asked Questions   During Pregnancy  Cats: A parasite can be excreted in cat feces.  To avoid exposure you need to have another person empty the little box.  If you must empty the litter box you will need to wear gloves.  Wash your hands after handling your cat.  This parasite can also be found in raw or undercooked meat so this should also be avoided.  Colds,  Sore Throats, Flu: Please check your medication sheet to see what you can take for symptoms.  If your symptoms are unrelieved by these medications please call the office.  Dental Work: Most any dental work your dentist recommends is permitted.  X-rays should only be taken during the first trimester if absolutely necessary.  Your abdomen should be shielded with a lead apron during all x-rays.  Please notify your provider prior to receiving any x-rays.  Novocaine is fine; gas is not recommended.  If your dentist requires a note from us prior to dental work please call the office and we will provide one for you.  Exercise: Exercise is an important part of staying healthy during your pregnancy.  You may continue most exercises you were accustomed to prior to pregnancy.  Later in your pregnancy you will most likely notice you have difficulty with activities requiring balance like riding a bicycle.  It is important that you listen to your body and avoid activities that put you at a higher risk of falling.  Adequate rest and staying well hydrated are a must!  If you have questions about the safety of specific activities ask your provider.    Exposure to Children with illness: Try to avoid obvious exposure; report any symptoms to us when noted,  If you have chicken pos, red measles or mumps, you should be immune to these diseases.   Please do not take any vaccines while pregnant unless you have checked with your OB provider.  Fetal Movement: After 28 weeks we recommend you do "kick counts" twice daily.  Lie or sit down in a calm quiet environment and count your baby movements "kicks".  You should feel your baby at least 10 times per hour.  If you have not felt 10 kicks within the first hour get up, walk around and have something sweet to eat or drink then repeat for an additional hour.  If count remains less than 10 per hour notify your provider.  Fumigating: Follow your pest control agent's advice as to how long  to stay out of your home.  Ventilate the area well before re-entering.  Hemorrhoids:   Most over-the-counter preparations can be used during pregnancy.  Check your medication to see what is safe to use.  It is important to use a stool softener or fiber in your diet and to drink lots of liquids.  If hemorrhoids seem to be getting worse please call the office.   Hot Tubs:  Hot tubs Jacuzzis and saunas are not recommended while pregnant.  These increase your internal body temperature and should be avoided.  Intercourse:  Sexual intercourse is safe during pregnancy as long as you are comfortable, unless otherwise advised by your provider.  Spotting may occur after intercourse; report any bright red bleeding that is heavier than spotting.  Labor:  If you know that you are in labor, please go to the hospital.  If you are unsure, please call the office and let us help you decide what to do.  Lifting, straining, etc:  If your job requires heavy lifting or   straining please check with your provider for any limitations.  Generally, you should not lift items heavier than that you can lift simply with your hands and arms (no back muscles)  Painting:  Paint fumes do not harm your pregnancy, but may make you ill and should be avoided if possible.  Latex or water based paints have less odor than oils.  Use adequate ventilation while painting.  Permanents & Hair Color:  Chemicals in hair dyes are not recommended as they cause increase hair dryness which can increase hair loss during pregnancy.  " Highlighting" and permanents are allowed.  Dye may be absorbed differently and permanents may not hold as well during pregnancy.  Sunbathing:  Use a sunscreen, as skin burns easily during pregnancy.  Drink plenty of fluids; avoid over heating.  Tanning Beds:  Because their possible side effects are still unknown, tanning beds are not recommended.  Ultrasound Scans:  Routine ultrasounds are performed at approximately 20  weeks.  You will be able to see your baby's general anatomy an if you would like to know the gender this can usually be determined as well.  If it is questionable when you conceived you may also receive an ultrasound early in your pregnancy for dating purposes.  Otherwise ultrasound exams are not routinely performed unless there is a medical necessity.  Although you can request a scan we ask that you pay for it when conducted because insurance does not cover " patient request" scans.  Work: If your pregnancy proceeds without complications you may work until your due date, unless your physician or employer advises otherwise.  Round Ligament Pain/Pelvic Discomfort:  Sharp, shooting pains not associated with bleeding are fairly common, usually occurring in the second trimester of pregnancy.  They tend to be worse when standing up or when you remain standing for long periods of time.  These are the result of pressure of certain pelvic ligaments called "round ligaments".  Rest, Tylenol and heat seem to be the most effective relief.  As the womb and fetus grow, they rise out of the pelvis and the discomfort improves.  Please notify the office if your pain seems different than that described.  It may represent a more serious condition.   

## 2016-02-29 LAB — CBC WITH DIFFERENTIAL/PLATELET
Basophils Absolute: 0 10*3/uL (ref 0.0–0.2)
Basos: 0 %
EOS (ABSOLUTE): 0.1 10*3/uL (ref 0.0–0.4)
Eos: 1 %
Hematocrit: 38.9 % (ref 34.0–46.6)
Hemoglobin: 13.7 g/dL (ref 11.1–15.9)
IMMATURE GRANULOCYTES: 0 %
Immature Grans (Abs): 0 10*3/uL (ref 0.0–0.1)
Lymphocytes Absolute: 1.5 10*3/uL (ref 0.7–3.1)
Lymphs: 22 %
MCH: 32.7 pg (ref 26.6–33.0)
MCHC: 35.2 g/dL (ref 31.5–35.7)
MCV: 93 fL (ref 79–97)
MONOS ABS: 0.5 10*3/uL (ref 0.1–0.9)
Monocytes: 7 %
NEUTROS PCT: 70 %
Neutrophils Absolute: 4.7 10*3/uL (ref 1.4–7.0)
PLATELETS: 241 10*3/uL (ref 150–379)
RBC: 4.19 x10E6/uL (ref 3.77–5.28)
RDW: 12.8 % (ref 12.3–15.4)
WBC: 6.8 10*3/uL (ref 3.4–10.8)

## 2016-02-29 LAB — ABO

## 2016-02-29 LAB — RH TYPE: Rh Factor: POSITIVE

## 2016-02-29 LAB — VARICELLA ZOSTER ANTIBODY, IGG: Varicella zoster IgG: 454 index (ref >165)

## 2016-02-29 LAB — RUBELLA SCREEN: RUBELLA: 2.91 {index} (ref >0.99)

## 2016-02-29 LAB — HIV ANTIBODY (ROUTINE TESTING W REFLEX): HIV SCREEN 4TH GENERATION: NONREACTIVE

## 2016-02-29 LAB — HEPATITIS B SURFACE ANTIGEN: Hepatitis B Surface Ag: NEGATIVE

## 2016-02-29 LAB — ANTIBODY SCREEN: Antibody Screen: NEGATIVE

## 2016-02-29 LAB — RPR: RPR Ser Ql: NONREACTIVE

## 2016-03-01 LAB — MONITOR DRUG PROFILE 14(MW)
AMPHETAMINE SCREEN URINE: NEGATIVE ng/mL
BARBITURATE SCREEN URINE: NEGATIVE ng/mL
BENZODIAZEPINE SCREEN, URINE: NEGATIVE ng/mL
Buprenorphine, Urine: NEGATIVE ng/mL
CANNABINOIDS UR QL SCN: NEGATIVE ng/mL
Cocaine (Metab) Scrn, Ur: NEGATIVE ng/mL
Creatinine(Crt), U: 35.2 mg/dL (ref 20.0–300.0)
FENTANYL, URINE: NEGATIVE pg/mL
METHADONE SCREEN, URINE: NEGATIVE ng/mL
Meperidine Screen, Urine: NEGATIVE ng/mL
OXYCODONE+OXYMORPHONE UR QL SCN: NEGATIVE ng/mL
Opiate Scrn, Ur: NEGATIVE ng/mL
PH UR, DRUG SCRN: 6.8 (ref 4.5–8.9)
Phencyclidine Qn, Ur: NEGATIVE ng/mL
Propoxyphene Scrn, Ur: NEGATIVE ng/mL
SPECIFIC GRAVITY: 1.007
Tramadol Screen, Urine: NEGATIVE ng/mL

## 2016-03-01 LAB — URINALYSIS, ROUTINE W REFLEX MICROSCOPIC
Bilirubin, UA: NEGATIVE
Glucose, UA: NEGATIVE
Ketones, UA: NEGATIVE
NITRITE UA: NEGATIVE
PH UA: 7 (ref 5.0–7.5)
Protein, UA: NEGATIVE
RBC, UA: NEGATIVE
Specific Gravity, UA: 1.006 (ref 1.005–1.030)
Urobilinogen, Ur: 0.2 mg/dL (ref 0.2–1.0)

## 2016-03-01 LAB — MICROSCOPIC EXAMINATION: CASTS: NONE SEEN /LPF

## 2016-03-01 LAB — CULTURE, OB URINE

## 2016-03-01 LAB — GC/CHLAMYDIA PROBE AMP
CHLAMYDIA, DNA PROBE: NEGATIVE
Neisseria gonorrhoeae by PCR: NEGATIVE

## 2016-03-01 LAB — URINE CULTURE, OB REFLEX

## 2016-03-01 LAB — NICOTINE SCREEN, URINE: COTININE UR QL SCN: NEGATIVE ng/mL

## 2016-03-05 ENCOUNTER — Encounter: Payer: Self-pay | Admitting: Obstetrics and Gynecology

## 2016-03-05 ENCOUNTER — Other Ambulatory Visit: Payer: Self-pay | Admitting: Obstetrics and Gynecology

## 2016-03-05 MED ORDER — FLUCONAZOLE 100 MG PO TABS: 100.0000 mg | ORAL_TABLET | Freq: Every day | ORAL | 0 refills | Status: DC

## 2016-03-08 ENCOUNTER — Encounter: Payer: Self-pay | Admitting: Obstetrics and Gynecology

## 2016-03-15 ENCOUNTER — Encounter: Payer: Self-pay | Admitting: Obstetrics and Gynecology

## 2016-03-20 ENCOUNTER — Ambulatory Visit (INDEPENDENT_AMBULATORY_CARE_PROVIDER_SITE_OTHER): Payer: 59 | Admitting: Obstetrics and Gynecology

## 2016-03-20 ENCOUNTER — Encounter: Payer: Self-pay | Admitting: Obstetrics and Gynecology

## 2016-03-20 VITALS — BP 120/75 | HR 82 | Wt 123.1 lb

## 2016-03-20 DIAGNOSIS — Z3A10 10 weeks gestation of pregnancy: Secondary | ICD-10-CM

## 2016-03-20 LAB — POCT URINALYSIS DIPSTICK
Bilirubin, UA: NEGATIVE
Glucose, UA: NEGATIVE
Ketones, UA: NEGATIVE
LEUKOCYTES UA: NEGATIVE
NITRITE UA: NEGATIVE
PH UA: 6
PROTEIN UA: NEGATIVE
RBC UA: NEGATIVE
Spec Grav, UA: 1.01
UROBILINOGEN UA: 0.2

## 2016-03-20 NOTE — Patient Instructions (Signed)
Second Trimester of Pregnancy The second trimester is from week 13 through week 28 (months 4 through 6). The second trimester is often a time when you feel your best. Your body has also adjusted to being pregnant, and you begin to feel better physically. Usually, morning sickness has lessened or quit completely, you may have more energy, and you may have an increase in appetite. The second trimester is also a time when the fetus is growing rapidly. At the end of the sixth month, the fetus is about 9 inches long and weighs about 1 pounds. You will likely begin to feel the baby move (quickening) between 18 and 20 weeks of the pregnancy. Body changes during your second trimester Your body continues to go through many changes during your second trimester. The changes vary from woman to woman.  Your weight will continue to increase. You will notice your lower abdomen bulging out.  You may begin to get stretch marks on your hips, abdomen, and breasts.  You may develop headaches that can be relieved by medicines. The medicines should be approved by your health care provider.  You may urinate more often because the fetus is pressing on your bladder.  You may develop or continue to have heartburn as a result of your pregnancy.  You may develop constipation because certain hormones are causing the muscles that push waste through your intestines to slow down.  You may develop hemorrhoids or swollen, bulging veins (varicose veins).  You may have back pain. This is caused by:  Weight gain.  Pregnancy hormones that are relaxing the joints in your pelvis.  A shift in weight and the muscles that support your balance.  Your breasts will continue to grow and they will continue to become tender.  Your gums may bleed and may be sensitive to brushing and flossing.  Dark spots or blotches (chloasma, mask of pregnancy) may develop on your face. This will likely fade after the baby is born.  A dark line  from your belly button to the pubic area (linea nigra) may appear. This will likely fade after the baby is born.  You may have changes in your hair. These can include thickening of your hair, rapid growth, and changes in texture. Some women also have hair loss during or after pregnancy, or hair that feels dry or thin. Your hair will most likely return to normal after your baby is born. What to expect at prenatal visits During a routine prenatal visit:  You will be weighed to make sure you and the fetus are growing normally.  Your blood pressure will be taken.  Your abdomen will be measured to track your baby's growth.  The fetal heartbeat will be listened to.  Any test results from the previous visit will be discussed. Your health care provider may ask you:  How you are feeling.  If you are feeling the baby move.  If you have had any abnormal symptoms, such as leaking fluid, bleeding, severe headaches, or abdominal cramping.  If you are using any tobacco products, including cigarettes, chewing tobacco, and electronic cigarettes.  If you have any questions. Other tests that may be performed during your second trimester include:  Blood tests that check for:  Low iron levels (anemia).  Gestational diabetes (between 24 and 28 weeks).  Rh antibodies. This is to check for a protein on red blood cells (Rh factor).  Urine tests to check for infections, diabetes, or protein in the urine.  An ultrasound to   confirm the proper growth and development of the baby.  An amniocentesis to check for possible genetic problems.  Fetal screens for spina bifida and Down syndrome.  HIV (human immunodeficiency virus) testing. Routine prenatal testing includes screening for HIV, unless you choose not to have this test. Follow these instructions at home: Eating and drinking  Continue to eat regular, healthy meals.  Avoid raw meat, uncooked cheese, cat litter boxes, and soil used by cats. These  carry germs that can cause birth defects in the baby.  Take your prenatal vitamins.  Take 1500-2000 mg of calcium daily starting at the 20th week of pregnancy until you deliver your baby.  If you develop constipation:  Take over-the-counter or prescription medicines.  Drink enough fluid to keep your urine clear or pale yellow.  Eat foods that are high in fiber, such as fresh fruits and vegetables, whole grains, and beans.  Limit foods that are high in fat and processed sugars, such as fried and sweet foods. Activity  Exercise only as directed by your health care provider. Experiencing uterine cramps is a good sign to stop exercising.  Avoid heavy lifting, wear low heel shoes, and practice good posture.  Wear your seat belt at all times when driving.  Rest with your legs elevated if you have leg cramps or low back pain.  Wear a good support bra for breast tenderness.  Do not use hot tubs, steam rooms, or saunas. Lifestyle  Avoid all smoking, herbs, alcohol, and unprescribed drugs. These chemicals affect the formation and growth of the baby.  Do not use any products that contain nicotine or tobacco, such as cigarettes and e-cigarettes. If you need help quitting, ask your health care provider.  A sexual relationship may be continued unless your health care provider directs you otherwise. General instructions  Follow your health care provider's instructions regarding medicine use. There are medicines that are either safe or unsafe to take during pregnancy.  Take warm sitz baths to soothe any pain or discomfort caused by hemorrhoids. Use hemorrhoid cream if your health care provider approves.  If you develop varicose veins, wear support hose. Elevate your feet for 15 minutes, 3-4 times a day. Limit salt in your diet.  Visit your dentist if you have not gone yet during your pregnancy. Use a soft toothbrush to brush your teeth and be gentle when you floss.  Keep all follow-up  prenatal visits as told by your health care provider. This is important. Contact a health care provider if:  You have dizziness.  You have mild pelvic cramps, pelvic pressure, or nagging pain in the abdominal area.  You have persistent nausea, vomiting, or diarrhea.  You have a bad smelling vaginal discharge.  You have pain with urination. Get help right away if:  You have a fever.  You are leaking fluid from your vagina.  You have spotting or bleeding from your vagina.  You have severe abdominal cramping or pain.  You have rapid weight gain or weight loss.  You have shortness of breath with chest pain.  You notice sudden or extreme swelling of your face, hands, ankles, feet, or legs.  You have not felt your baby move in over an hour.  You have severe headaches that do not go away with medicine.  You have vision changes. Summary  The second trimester is from week 13 through week 28 (months 4 through 6). It is also a time when the fetus is growing rapidly.  Your body goes   through many changes during pregnancy. The changes vary from woman to woman.  Avoid all smoking, herbs, alcohol, and unprescribed drugs. These chemicals affect the formation and growth your baby.  Do not use any tobacco products, such as cigarettes, chewing tobacco, and e-cigarettes. If you need help quitting, ask your health care provider.  Contact your health care provider if you have any questions. Keep all prenatal visits as told by your health care provider. This is important. This information is not intended to replace advice given to you by your health care provider. Make sure you discuss any questions you have with your health care provider. Document Released: 01/08/2001 Document Revised: 06/22/2015 Document Reviewed: 03/17/2012 Elsevier Interactive Patient Education  2017 Elsevier Inc.  

## 2016-03-20 NOTE — Progress Notes (Signed)
NOB physical- pt is doing well, states her nausea is getting better

## 2016-03-20 NOTE — Progress Notes (Signed)
NEW OB HISTORY AND PHYSICAL  SUBJECTIVE:       Jenny Mcguire is a 31 y.o. [id number] female, Patient's last menstrual period was 01/05/2016 (exact date)., Estimated Date of Delivery: 10/11/16, [id number], presents today for establishment of Prenatal Care. She has no unusual complaints       Gynecologic History Patient's last menstrual period was 01/05/2016 (exact date). Normal Contraception: none Last Pap: 2016. Results were: normal  Obstetric History OB History  Gravida Para Term Preterm AB Living  2 1 1     1   SAB TAB Ectopic Multiple Live Births          1    # Outcome Date GA Lbr Len/2nd Weight Sex Delivery Anes PTL Lv  2 Current           1 Term 2011    F Vag-Spont  N LIV      Past Medical History:  Diagnosis Date  . Amenorrhea   . History of UTI     Past Surgical History:  Procedure Laterality Date  . BREAST ENHANCEMENT SURGERY      Current Outpatient Prescriptions on File Prior to Visit  Medication Sig Dispense Refill  . Prenatal Vit-Fe Fumarate-FA (PRENATAL VITAMINS PLUS PO) Take by mouth.    . fluconazole (DIFLUCAN) 100 MG tablet Take 1 tablet (100 mg total) by mouth daily. (Patient not taking: Reported on 03/20/2016) 7 tablet 0   No current facility-administered medications on file prior to visit.     Allergies  Allergen Reactions  . Sulfa Antibiotics     Social History   Social History  . Marital status: Married    Spouse name: N/A  . Number of children: N/A  . Years of education: N/A   Occupational History  . Not on file.   Social History Main Topics  . Smoking status: Never Smoker  . Smokeless tobacco: Never Used  . Alcohol use Yes     Comment: occas  . Drug use: No  . Sexual activity: Yes   Other Topics Concern  . Not on file   Social History Narrative  . No narrative on file    Family History  Problem Relation Age of Onset  . Cancer Neg Hx   . Heart failure Neg Hx   . Diabetes Neg Hx     The following portions of the  patient's history were reviewed and updated as appropriate: allergies, current medications, past OB history, past medical history, past surgical history, past family history, past social history, and problem list.    OBJECTIVE: Initial Physical Exam (New OB)  GENERAL APPEARANCE: alert, well appearing, in no apparent distress, oriented to person, place and time HEAD: normocephalic, atraumatic MOUTH: mucous membranes moist, pharynx normal without lesions and dental hygiene good THYROID: no thyromegaly or masses present BREASTS: not examined LUNGS: clear to auscultation, no wheezes, rales or rhonchi, symmetric air entry HEART: regular rate and rhythm, no murmurs ABDOMEN: soft, nontender, nondistended, no abnormal masses, no epigastric pain, fundus not palpable and FHT present EXTREMITIES: no redness or tenderness in the calves or thighs SKIN: normal coloration and turgor, no rashes LYMPH NODES: no adenopathy palpable NEUROLOGIC: alert, oriented, normal speech, no focal findings or movement disorder noted  PELVIC EXAM deferred  ASSESSMENT: Normal pregnancy  PLAN: Prenatal care Genetic screening with [facility] obtained today See orders

## 2016-03-22 ENCOUNTER — Encounter: Payer: Self-pay | Admitting: Obstetrics and Gynecology

## 2016-03-26 ENCOUNTER — Encounter: Payer: Self-pay | Admitting: Obstetrics and Gynecology

## 2016-03-28 ENCOUNTER — Encounter: Payer: Self-pay | Admitting: Obstetrics and Gynecology

## 2016-03-28 ENCOUNTER — Other Ambulatory Visit: Payer: Self-pay | Admitting: Obstetrics and Gynecology

## 2016-04-08 ENCOUNTER — Encounter: Payer: Self-pay | Admitting: Obstetrics and Gynecology

## 2016-04-09 ENCOUNTER — Encounter: Payer: Self-pay | Admitting: Obstetrics and Gynecology

## 2016-04-17 ENCOUNTER — Encounter: Payer: Self-pay | Admitting: Certified Nurse Midwife

## 2016-04-17 ENCOUNTER — Ambulatory Visit (INDEPENDENT_AMBULATORY_CARE_PROVIDER_SITE_OTHER): Payer: 59 | Admitting: Certified Nurse Midwife

## 2016-04-17 VITALS — BP 118/75 | HR 102 | Wt 129.0 lb

## 2016-04-17 DIAGNOSIS — Z3482 Encounter for supervision of other normal pregnancy, second trimester: Secondary | ICD-10-CM

## 2016-04-17 LAB — POCT URINALYSIS DIPSTICK
BILIRUBIN UA: NEGATIVE
Blood, UA: NEGATIVE
GLUCOSE UA: NEGATIVE
Ketones, UA: NEGATIVE
Nitrite, UA: NEGATIVE
PROTEIN UA: NEGATIVE
SPEC GRAV UA: 1.005 (ref 1.030–1.035)
Urobilinogen, UA: NEGATIVE (ref ?–2.0)
pH, UA: 6 (ref 5.0–8.0)

## 2016-04-17 NOTE — Patient Instructions (Signed)

## 2016-04-17 NOTE — Progress Notes (Signed)
ROB @ 14 wks 5 days, Denies L.O.F. and contractions. States that she had some spotting after exercising. She was doing squats and lounges/floor exercises. She states that it was only with wiping and it resolved afterwards. Informed her that, that is her body telling her she may have been overdoing it. Encouraged her to continue exercising but to decrease the intensity. She is feeling occasional fluttering, which she suspects is fetal movement. P.T.L. precautions reviewed. She will return approximately 4 wks for ROB and anatomy scan.   Doreene BurkeAnnie Christianjames Soule, CNM

## 2016-05-17 ENCOUNTER — Other Ambulatory Visit: Payer: Self-pay | Admitting: Certified Nurse Midwife

## 2016-05-17 DIAGNOSIS — Z369 Encounter for antenatal screening, unspecified: Secondary | ICD-10-CM

## 2016-05-22 ENCOUNTER — Ambulatory Visit (INDEPENDENT_AMBULATORY_CARE_PROVIDER_SITE_OTHER): Payer: 59 | Admitting: Certified Nurse Midwife

## 2016-05-22 ENCOUNTER — Encounter: Payer: Self-pay | Admitting: Certified Nurse Midwife

## 2016-05-22 ENCOUNTER — Encounter: Payer: Self-pay | Admitting: Obstetrics and Gynecology

## 2016-05-22 ENCOUNTER — Ambulatory Visit (INDEPENDENT_AMBULATORY_CARE_PROVIDER_SITE_OTHER): Payer: 59

## 2016-05-22 VITALS — BP 110/69 | HR 95 | Wt 136.6 lb

## 2016-05-22 DIAGNOSIS — Z3490 Encounter for supervision of normal pregnancy, unspecified, unspecified trimester: Secondary | ICD-10-CM | POA: Insufficient documentation

## 2016-05-22 DIAGNOSIS — Z369 Encounter for antenatal screening, unspecified: Secondary | ICD-10-CM

## 2016-05-22 DIAGNOSIS — Z3482 Encounter for supervision of other normal pregnancy, second trimester: Secondary | ICD-10-CM

## 2016-05-22 LAB — POCT URINALYSIS DIPSTICK
Bilirubin, UA: NEGATIVE
Blood, UA: NEGATIVE
GLUCOSE UA: NEGATIVE
Ketones, UA: NEGATIVE
LEUKOCYTES UA: NEGATIVE
NITRITE UA: NEGATIVE
PROTEIN UA: NEGATIVE
Spec Grav, UA: 1.015 (ref 1.010–1.025)
UROBILINOGEN UA: 0.2 U/dL
pH, UA: 6 (ref 5.0–8.0)

## 2016-05-22 NOTE — Progress Notes (Signed)
Jenny Mcguire, doing well. Anatomy scan today shows echogenic focus in left ventricle . MFM consult requested. Discussed with pt and her family. Verbalizes understanding and agrees to plan. Follow for Jenny Mcguire in 4 wks.   Doreene Burke, CNM  ULTRASOUND REPORT  Location: [facility] Date of Service: 05/22/16  Indications: Anatomy Findings:  Singleton intrauterine pregnancy is visualized with FHR at 149 BPM. Biometrics give an (U/S) Gestational age of [redacted] weeks and 6 days, and an (U/S) EDD of 10/10/16; this correlates with the clinically established EDD of 10/11/16.  Fetal presentation is vertex, spine variable.  EFW: 304 grams ( 0 lbs. 11 oz). Placenta: Posterior, grade 0 and remote to cervix at 4.9 cm. AFI: Subjectively adequate with an MVP of 3.5 cm  Anatomic survey is complete. There is an echogenic intracardiac focus seen in the left ventricle, otherwise, all other anatomy appears WNL. Gender - Female.   Right Ovary measures 3.6 x 1.7 x 2.1 cm, and appears WNL. Left ovary is not visualized.. There is no evidence of a corpus luteal cyst. Survey of the adnexa demonstrates no adnexal masses. There is no free peritoneal fluid in the cul de sac.  Impression: 1. 19 week 6 day Viable Singleton Intrauterine pregnancy by U/S. 2. (U/S) EDD is consistent with Clinically established (LMP) EDD of 10/11/16. 3. Echogenic intracardiac focus, left ventricle, otherwise all other anatomy appears WNL.  Recommendations: 1.Clinical correlation with the patient's History and Physical Exam.   Revonda Humphrey, RDMS, RVT

## 2016-05-22 NOTE — Patient Instructions (Signed)

## 2016-05-24 ENCOUNTER — Encounter: Payer: Self-pay | Admitting: Obstetrics and Gynecology

## 2016-05-24 ENCOUNTER — Other Ambulatory Visit: Payer: Self-pay | Admitting: Certified Nurse Midwife

## 2016-05-24 DIAGNOSIS — Z3689 Encounter for other specified antenatal screening: Secondary | ICD-10-CM

## 2016-05-29 ENCOUNTER — Encounter: Payer: Self-pay | Admitting: Certified Nurse Midwife

## 2016-05-30 ENCOUNTER — Encounter: Payer: Self-pay | Admitting: Obstetrics and Gynecology

## 2016-06-03 ENCOUNTER — Ambulatory Visit: Payer: Self-pay

## 2016-06-03 ENCOUNTER — Ambulatory Visit
Admission: RE | Admit: 2016-06-03 | Discharge: 2016-06-03 | Disposition: A | Discharge status: Home / Self Care | Payer: 59 | Source: Ambulatory Visit | Attending: Maternal & Fetal Medicine | Admitting: Maternal & Fetal Medicine

## 2016-06-03 ENCOUNTER — Ambulatory Visit (HOSPITAL_BASED_OUTPATIENT_CLINIC_OR_DEPARTMENT_OTHER)
Admission: RE | Admit: 2016-06-03 | Discharge: 2016-06-03 | Disposition: A | Discharge status: Home / Self Care | Payer: 59 | Source: Ambulatory Visit

## 2016-06-03 VITALS — BP 123/81 | HR 121 | Temp 98.2°F | Resp 20 | Ht 62.0 in | Wt 139.0 lb

## 2016-06-03 DIAGNOSIS — Z369 Encounter for antenatal screening, unspecified: Secondary | ICD-10-CM | POA: Diagnosis present

## 2016-06-03 DIAGNOSIS — Z3A22 22 weeks gestation of pregnancy: Secondary | ICD-10-CM | POA: Insufficient documentation

## 2016-06-03 DIAGNOSIS — O283 Abnormal ultrasonic finding on antenatal screening of mother: Secondary | ICD-10-CM | POA: Diagnosis not present

## 2016-06-03 DIAGNOSIS — Z3689 Encounter for other specified antenatal screening: Secondary | ICD-10-CM

## 2016-06-03 NOTE — Progress Notes (Signed)
Patient seen and evaluated by me,  I agree with the assessment and plan as outlined in [facility]'s note.

## 2016-06-03 NOTE — Progress Notes (Signed)
Jenny Mcguire Length of Consultation: 30 minutes   Ms. Jenny Mcguire  was referred for genetic counseling following a level 2 ultrasound at [facility].  This letter is a summary of our discussion.  At the time of this visit, a detailed ultrasound was performed.  The gestational age was confirmed to be 21 weeks.  The detailed fetal anatomy was seen and appeared normal, including the structures of the fetal heart.  An echogenic intracardiac focus was noted in the left ventricle of the heart.    An echogenic intracardiac focus (EIF) is a bright spot in the heart.  They are usually found in the left ventricle, which is one of the lower chambers of the heart.  This finding is thought to occur in at least 3-5% of second trimester ultrasounds as a result of microcalcifications in the papillary muscle of the heart.  While it is most likely a normal variation, some studies have found an association between this finding and chromosome abnormalities.  The current data suggests that as an isolated ultrasound finding, the chance for a chromosome problem is not expected to be higher than the risk from an amniocentesis (1 in 200).  This is particularly thought to be true in women under the age of 35 years or who have had normal results from some type of maternal serum screen for aneuploidy.  To review, chromosomes are the inherited structures that contain our genes (traits).  Each cell of our body normally has 46 chromosomes, matched up in 23 pairs.  The last pair determines our gender and are called the sex chromosomes.  A female has an X and a Y chromosome, while a female has two X chromosomes.  Rarely, when a mother's egg or father's sperm unite, an extra or missing chromosome can be passed on to the baby by mistake.  We discussed examples of such a problem including: Down syndrome (an extra 73) and Edward syndrome (an extra 18), both involving some degree of mental retardation and physical  problems.  Before this ultrasound, there was a 1 in approximately 385 chance to having a baby with a chromosome problem based on Ms. Jenny Mcguire's age.  Now that the echogenic focus has been seen, the risk has increased, though exactly by how much is difficult to determine.  Ms. Jenny Mcguire elected to have cell free DNA testing (Panorama) through her OB in the first trimester.  This test utilizes a maternal blood sample and DNA sequencing technology to isolate circulating cell free fetal DNA from maternal plasma.  The fetal DNA can then be analyzed for DNA sequences that are derived from the three most common chromosomes involved in aneuploidy, chromosomes 13, 18, and 21.  If the overall amount of DNA is greater than the expected level for any of these chromosomes, aneuploidy is suspected. While we do not consider it a replacement for invasive testing and karyotype analysis, a negative result from this testing would be reassuring, though not a guarantee of a normal chromosome complement for the baby.  An abnormal result is certainly suggestive of an abnormal chromosome complement, though we would still recommend CVS or amniocentesis to confirm any findings from this testing.  The Panorama results were normal for Ms. Jenny Mcguire, giving approximately a 1 in 10,000 chance for one of these chromosome conditions in the pregnancy. The other testing option available if the patient is still anxious would be amniocentesis.  We briefly reviewed this option including the risks and benefits.  The patient declined,  as the chance for aneuploidy is expected to be lower than the risk of the procedure.    Lastly, we mentioned that each pregnancy in the general population has a 2-3% chance for a birth defect that might not be detected prenatally.  We inquired about routine screening for CF and SMA.   Both conditions are recessive, which means that both parents must be carriers in order to have a child with the disease.  Cystic fibrosis (CF) is  one of the most common genetic conditions in persons of Caucasian ancestry.  This condition occurs in approximately 1 in 2,500 Caucasian persons and results in thickened secretions in the lungs, digestive, and reproductive systems.  For a baby to be at risk for having CF, both of the parents must be carriers for this condition.  Approximately 1 in 8225 Caucasian persons is a carrier for CF.  Current carrier testing looks for the most common mutations in the gene for CF and can detect approximately 90% of carriers in the Caucasian population.  This means that the carrier screening can greatly reduce, but cannot eliminate, the chance for an individual to have a child with CF.  If an individual is found to be a carrier for CF, then carrier testing would be available for the partner. As part of [place]'s newborn screening profile, all babies born in the state of [place] will have a two-tier screening process.  Specimens are first tested to determine the concentration of immunoreactive trypsinogen (IRT).  The top 5% of specimens with the highest IRT values then undergo DNA testing using a panel of over 40 common CF mutations. SMA is a neurodegenerative disorder that leads to atrophy of skeletal muscle and overall weakness.  This condition is also more prevalent in the Caucasian population, with 1 in 40-1 in 60 persons being a carrier and 1 in 6,000-1 in 10,000 children being affected.  There are multiple forms of the disease, with some causing death in infancy to other forms with survival into adulthood.  The genetics of SMA is complex, but carrier screening can detect up to 95% of carriers in the Caucasian population.  Similar to CF, a negative result can greatly reduce, but cannot eliminate, the chance to have a child with SMA.  We obtained a detailed family history.  The family history was reported to be unremarkable for birth defects, developmental delays, recurrent pregnancy loss or known chromosome  abnormalities.  The pregnancy history was unremarkable for complications or exposures which would be expected to increase the concern for this pregnancy.   After consideration of the options, the patient elected to have a detailed ultrasound today and to declined amniocentesis.  She stated that carrier screening was performed previously and was negative.  The patient was encouraged to call with questions or concerns.  We can be contacted at [phone].   Cherly Andersoneborah F. Blakelyn Dinges, MS, CGC

## 2016-06-14 ENCOUNTER — Encounter: Payer: Self-pay | Admitting: Certified Nurse Midwife

## 2016-06-25 ENCOUNTER — Ambulatory Visit (INDEPENDENT_AMBULATORY_CARE_PROVIDER_SITE_OTHER): Payer: 59 | Admitting: Certified Nurse Midwife

## 2016-06-25 ENCOUNTER — Encounter: Payer: Self-pay | Admitting: Certified Nurse Midwife

## 2016-06-25 VITALS — BP 115/74 | HR 107 | Wt 145.0 lb

## 2016-06-25 DIAGNOSIS — Z3482 Encounter for supervision of other normal pregnancy, second trimester: Secondary | ICD-10-CM

## 2016-06-25 DIAGNOSIS — Z130 Encounter for screening for diseases of the blood and blood-forming organs and certain disorders involving the immune mechanism: Secondary | ICD-10-CM

## 2016-06-25 DIAGNOSIS — Z131 Encounter for screening for diabetes mellitus: Secondary | ICD-10-CM

## 2016-06-25 LAB — POCT URINALYSIS DIPSTICK
BILIRUBIN UA: NEGATIVE
Blood, UA: NEGATIVE
Glucose, UA: NEGATIVE
KETONES UA: NEGATIVE
LEUKOCYTES UA: NEGATIVE
NITRITE UA: NEGATIVE
PH UA: 6 (ref 5.0–8.0)
Protein, UA: NEGATIVE
Spec Grav, UA: 1.01 (ref 1.010–1.025)
Urobilinogen, UA: 0.2 E.U./dL

## 2016-06-25 NOTE — Progress Notes (Signed)
ROB-Pt doing well, no questions or concerns. MFM appointment was 06/03/16, no further follow up needed. Single episode of spotting with wiping two (2) weeks ago during field trip, but nothing since. Reviewed red flag symptoms and when to call. Discussed TDaP and Blood Transfusion consent at 28 weeks. RTC x 4 weeks for glucola, H/H, and ROB.

## 2016-06-25 NOTE — Patient Instructions (Signed)
Round Ligament Pain The round ligament is a cord of muscle and tissue that helps to support the uterus. It can become a source of pain during pregnancy if it becomes stretched or twisted as the baby grows. The pain usually begins in the second trimester of pregnancy, and it can come and go until the baby is delivered. It is not a serious problem, and it does not cause harm to the baby. Round ligament pain is usually a short, sharp, and pinching pain, but it can also be a dull, lingering, and aching pain. The pain is felt in the lower side of the abdomen or in the groin. It usually starts deep in the groin and moves up to the outside of the hip area. Pain can occur with:  A sudden change in position.  Rolling over in bed.  Coughing or sneezing.  Physical activity. Follow these instructions at home: Watch your condition for any changes. Take these steps to help with your pain:  When the pain starts, relax. Then try:  Sitting down.  Flexing your knees up to your abdomen.  Lying on your side with one pillow under your abdomen and another pillow between your legs.  Sitting in a warm bath for 15-20 minutes or until the pain goes away.  Take over-the-counter and prescription medicines only as told by your health care provider.  Move slowly when you sit and stand.  Avoid long walks if they cause pain.  Stop or lessen your physical activities if they cause pain. Contact a health care provider if:  Your pain does not go away with treatment.  You feel pain in your back that you did not have before.  Your medicine is not helping. Get help right away if:  You develop a fever or chills.  You develop uterine contractions.  You develop vaginal bleeding.  You develop nausea or vomiting.  You develop diarrhea.  You have pain when you urinate. This information is not intended to replace advice given to you by your health care provider. Make sure you discuss any questions you have with  your health care provider. Document Released: 10/24/2007 Document Revised: 06/22/2015 Document Reviewed: 03/23/2014 Elsevier Interactive Patient Education  2017 Elsevier Inc.  

## 2016-06-26 ENCOUNTER — Encounter: Payer: Self-pay | Admitting: Obstetrics and Gynecology

## 2016-07-23 ENCOUNTER — Other Ambulatory Visit: Payer: 59

## 2016-07-23 ENCOUNTER — Ambulatory Visit (INDEPENDENT_AMBULATORY_CARE_PROVIDER_SITE_OTHER): Payer: 59 | Admitting: Certified Nurse Midwife

## 2016-07-23 ENCOUNTER — Encounter: Payer: Self-pay | Admitting: Certified Nurse Midwife

## 2016-07-23 VITALS — BP 107/69 | HR 98 | Wt 147.7 lb

## 2016-07-23 DIAGNOSIS — Z130 Encounter for screening for diseases of the blood and blood-forming organs and certain disorders involving the immune mechanism: Secondary | ICD-10-CM

## 2016-07-23 DIAGNOSIS — Z23 Encounter for immunization: Secondary | ICD-10-CM

## 2016-07-23 DIAGNOSIS — Z3482 Encounter for supervision of other normal pregnancy, second trimester: Secondary | ICD-10-CM

## 2016-07-23 DIAGNOSIS — Z131 Encounter for screening for diabetes mellitus: Secondary | ICD-10-CM

## 2016-07-23 DIAGNOSIS — Z3483 Encounter for supervision of other normal pregnancy, third trimester: Secondary | ICD-10-CM | POA: Diagnosis not present

## 2016-07-23 LAB — POCT URINALYSIS DIPSTICK
BILIRUBIN UA: NEGATIVE
Glucose, UA: NEGATIVE
KETONES UA: NEGATIVE
NITRITE UA: NEGATIVE
PH UA: 6 (ref 5.0–8.0)
PROTEIN UA: NEGATIVE
RBC UA: NEGATIVE
Spec Grav, UA: 1.015 (ref 1.010–1.025)
Urobilinogen, UA: 0.2 E.U./dL

## 2016-07-23 NOTE — Progress Notes (Signed)
ROB, doing well. No complaints. TDap, Blood consent, and 1 hr GTT today. Discussed cord blood donation (private/public), and birthing classes. Follow up in 2 wks.   Doreene BurkeAnnie Catie Chiao, CNM

## 2016-07-23 NOTE — Patient Instructions (Addendum)
Glucose Tolerance Test During Pregnancy The glucose tolerance test (GTT) is a blood test used to determine if you have developed a type of diabetes during pregnancy (gestational diabetes). This is when your body does not properly process sugar (glucose) in the food you eat, resulting in high blood glucose levels. Typically, a GTT is done after you have had a 1-hour glucose test with results that indicate you possibly have gestational diabetes. It may also be done if:  You have a history of giving birth to very large babies or have experienced repeated fetal loss (stillbirth).  You have signs and symptoms of diabetes, such as: ? Changes in your vision. ? Tingling or numbness in your hands or feet. ? Changes in hunger, thirst, and urination not otherwise explained by your pregnancy.  The GTT lasts about 3 hours. You will be given a sugar-water solution to drink at the beginning of the test. You will have blood drawn before you drink the solution and then again 1, 2, and 3 hours after you drink it. You will not be allowed to eat or drink anything else during the test. You must remain at the testing location to make sure that your blood is drawn on time. You should also avoid exercising during the test, because exercise can alter test results. How do I prepare for this test? Eat normally for 3 days prior to the GTT test, including having plenty of carbohydrate-rich foods. Do not eat or drink anything except water during the final 12 hours before the test. In addition, your health care provider may ask you to stop taking certain medicines before the test. What do the results mean? It is your responsibility to obtain your test results. Ask the lab or department performing the test when and how you will get your results. Contact your health care provider to discuss any questions you have about your results. Range of Normal Values Ranges for normal values may vary among different labs and hospitals. You  should always check with your health care provider after having lab work or other tests done to discuss whether your values are considered within normal limits. Normal levels of blood glucose are as follows:  Fasting: less than 105 mg/dL.  1 hour after drinking the solution: less than 190 mg/dL.  2 hours after drinking the solution: less than 165 mg/dL.  3 hours after drinking the solution: less than 145 mg/dL.  Some substances can interfere with GTT results. These may include:  Blood pressure and heart failure medicines, including beta blockers, furosemide, and thiazides.  Anti-inflammatory medicines, including aspirin.  Nicotine.  Some psychiatric medicines.  Meaning of Results Outside Normal Value Ranges GTT test results that are above normal values may indicate a number of health problems, such as:  Gestational diabetes.  Acute stress response.  Cushing syndrome.  Tumors such as pheochromocytoma or glucagonoma.  Long-term kidney problems.  Pancreatitis.  Hyperthyroidism.  Current infection.  Discuss your test results with your health care provider. He or she will use the results to make a diagnosis and determine a treatment plan that is right for you. This information is not intended to replace advice given to you by your health care provider. Make sure you discuss any questions you have with your health care provider. Document Released: 07/16/2011 Document Revised: 06/22/2015 Document Reviewed: 05/21/2013 Elsevier Interactive Patient Education  2017 [employer]. Tdap Vaccine (Tetanus, Diphtheria and Pertussis): What You Need to Know 1. Why get vaccinated? Tetanus, diphtheria and pertussis are very  serious diseases. Tdap vaccine can protect us from these diseases. And, Tdap vaccine given to pregnant women can protect newborn babies against pertussis. TETANUS (Lockjaw) is rare in the [place] today. It causes painful muscle tightening and stiffness, usually  all over the body.  It can lead to tightening of muscles in the head and neck so you can't open your mouth, swallow, or sometimes even breathe. Tetanus kills about 1 out of 10 people who are infected even after receiving the best medical care.  DIPHTHERIA is also rare in the [place] today. It can cause a thick coating to form in the back of the throat.  It can lead to breathing problems, heart failure, paralysis, and death.  PERTUSSIS (Whooping Cough) causes severe coughing spells, which can cause difficulty breathing, vomiting and disturbed sleep.  It can also lead to weight loss, incontinence, and rib fractures. Up to 2 in 100 adolescents and 5 in 100 adults with pertussis are hospitalized or have complications, which could include pneumonia or death.  These diseases are caused by bacteria. Diphtheria and pertussis are spread from person to person through secretions from coughing or sneezing. Tetanus enters the body through cuts, scratches, or wounds. Before vaccines, as many as 200,000 cases of diphtheria, 200,000 cases of pertussis, and hundreds of cases of tetanus, were reported in the [place] each year. Since vaccination began, reports of cases for tetanus and diphtheria have dropped by about 99% and for pertussis by about 80%. 2. Tdap vaccine Tdap vaccine can protect adolescents and adults from tetanus, diphtheria, and pertussis. One dose of Tdap is routinely given at age 31 or 5212. People who did not get Tdap at that age should get it as soon as possible. Tdap is especially important for healthcare professionals and anyone having close contact with a baby younger than 12 months. Pregnant women should get a dose of Tdap during every pregnancy, to protect the newborn from pertussis. Infants are most at risk for severe, life-threatening complications from pertussis. Another vaccine, called Td, protects against tetanus and diphtheria, but not pertussis. A Td booster should be given  every 10 years. Tdap may be given as one of these boosters if you have never gotten Tdap before. Tdap may also be given after a severe cut or burn to prevent tetanus infection. Your doctor or the person giving you the vaccine can give you more information. Tdap may safely be given at the same time as other vaccines. 3. Some people should not get this vaccine  A person who has ever had a life-threatening allergic reaction after a previous dose of any diphtheria, tetanus or pertussis containing vaccine, OR has a severe allergy to any part of this vaccine, should not get Tdap vaccine. Tell the person giving the vaccine about any severe allergies.  Anyone who had coma or long repeated seizures within 7 days after a childhood dose of DTP or DTaP, or a previous dose of Tdap, should not get Tdap, unless a cause other than the vaccine was found. They can still get Td.  Talk to your doctor if you: ? have seizures or another nervous system problem, ? had severe pain or swelling after any vaccine containing diphtheria, tetanus or pertussis, ? ever had a condition called Guillain-Barr Syndrome (GBS), ? aren't feeling well on the day the shot is scheduled. 4. Risks With any medicine, including vaccines, there is a chance of side effects. These are usually mild and go away on their own. Serious reactions  are also possible but are rare. Most people who get Tdap vaccine do not have any problems with it. Mild problems following Tdap: (Did not interfere with activities)  Pain where the shot was given (about 3 in 4 adolescents or 2 in 3 adults)  Redness or swelling where the shot was given (about 1 person in 5)  Mild fever of at least 100.102F (up to about 1 in 25 adolescents or 1 in 100 adults)  Headache (about 3 or 4 people in 10)  Tiredness (about 1 person in 3 or 4)  Nausea, vomiting, diarrhea, stomach ache (up to 1 in 4 adolescents or 1 in 10 adults)  Chills, sore joints (about 1 person in  10)  Body aches (about 1 person in 3 or 4)  Rash, swollen glands (uncommon)  Moderate problems following Tdap: (Interfered with activities, but did not require medical attention)  Pain where the shot was given (up to 1 in 5 or 6)  Redness or swelling where the shot was given (up to about 1 in 16 adolescents or 1 in 12 adults)  Fever over 102F (about 1 in 100 adolescents or 1 in 250 adults)  Headache (about 1 in 7 adolescents or 1 in 10 adults)  Nausea, vomiting, diarrhea, stomach ache (up to 1 or 3 people in 100)  Swelling of the entire arm where the shot was given (up to about 1 in 500).  Severe problems following Tdap: (Unable to perform usual activities; required medical attention)  Swelling, severe pain, bleeding and redness in the arm where the shot was given (rare).  Problems that could happen after any vaccine:  People sometimes faint after a medical procedure, including vaccination. Sitting or lying down for about 15 minutes can help prevent fainting, and injuries caused by a fall. Tell your doctor if you feel dizzy, or have vision changes or ringing in the ears.  Some people get severe pain in the shoulder and have difficulty moving the arm where a shot was given. This happens very rarely.  Any medication can cause a severe allergic reaction. Such reactions from a vaccine are very rare, estimated at fewer than 1 in a million doses, and would happen within a few minutes to a few hours after the vaccination. As with any medicine, there is a very remote chance of a vaccine causing a serious injury or death. The safety of vaccines is always being monitored. For more information, visit: http://floyd.org/www.cdc.gov/vaccinesafety/ 5. What if there is a serious problem? What should I look for? Look for anything that concerns you, such as signs of a severe allergic reaction, very high fever, or unusual behavior. Signs of a severe allergic reaction can include hives, swelling of the face and  throat, difficulty breathing, a fast heartbeat, dizziness, and weakness. These would usually start a few minutes to a few hours after the vaccination. What should I do?  If you think it is a severe allergic reaction or other emergency that can't wait, call 9-1-1 or get the person to the nearest hospital. Otherwise, call your doctor.  Afterward, the reaction should be reported to the Vaccine Adverse Event Reporting System (VAERS). Your doctor might file this report, or you can do it yourself through the VAERS web site at www.vaers.LAgents.nohhs.gov, or by calling 1-630-643-4387. ? VAERS does not give medical advice. 6. The National Vaccine Injury Compensation Program The National Vaccine Injury Compensation Program (VICP) is a federal program that was created to compensate people who may have been injured  by certain vaccines. Persons who believe they may have been injured by a vaccine can learn about the program and about filing a claim by calling (765)063-6735 or visiting the [place] website at GoldCloset.com.ee. There is a time limit to file a claim for compensation. 7. How can I learn more?  Ask your doctor. He or she can give you the vaccine package insert or suggest other sources of information.  Call your local or state health department.  Contact the Centers for Disease Control and Prevention (CDC): ? Call 435-530-0620 (1-800-CDC-INFO) or ? Visit CDC's website at http://hunter.com/ CDC Tdap Vaccine VIS (03/23/13) This information is not intended to replace advice given to you by your health care provider. Make sure you discuss any questions you have with your health care provider. Document Released: 07/16/2011 Document Revised: 10/05/2015 Document Reviewed: 10/05/2015 Elsevier Interactive Patient Education  2017 [employer].

## 2016-07-24 LAB — HEMOGLOBIN AND HEMATOCRIT, BLOOD
HEMOGLOBIN: 12 g/dL (ref 11.1–15.9)
Hematocrit: 34.6 % (ref 34.0–46.6)

## 2016-07-24 LAB — GLUCOSE, 1 HOUR GESTATIONAL: Gestational Diabetes Screen: 125 mg/dL (ref 65–139)

## 2016-08-06 ENCOUNTER — Ambulatory Visit (INDEPENDENT_AMBULATORY_CARE_PROVIDER_SITE_OTHER): Payer: 59 | Admitting: Obstetrics and Gynecology

## 2016-08-06 VITALS — BP 107/68 | HR 94 | Wt 151.9 lb

## 2016-08-06 DIAGNOSIS — Z3493 Encounter for supervision of normal pregnancy, unspecified, third trimester: Secondary | ICD-10-CM

## 2016-08-06 LAB — POCT URINALYSIS DIPSTICK
Bilirubin, UA: NEGATIVE
Blood, UA: NEGATIVE
Glucose, UA: NEGATIVE
Ketones, UA: NEGATIVE
LEUKOCYTES UA: NEGATIVE
Nitrite, UA: NEGATIVE
PROTEIN UA: NEGATIVE
Spec Grav, UA: 1.01 (ref 1.010–1.025)
UROBILINOGEN UA: 0.2 U/dL
pH, UA: 6 (ref 5.0–8.0)

## 2016-08-06 NOTE — Progress Notes (Signed)
ROB-doing well, plans sister-in-law and spouse for labor support, h/o precipitous labor 2 1/2 hours, didn't get pain meds, plans breastfeeding

## 2016-08-06 NOTE — Progress Notes (Signed)
ROB- pt is doing well 

## 2016-08-20 ENCOUNTER — Encounter: Payer: Self-pay | Admitting: Certified Nurse Midwife

## 2016-08-20 ENCOUNTER — Ambulatory Visit (INDEPENDENT_AMBULATORY_CARE_PROVIDER_SITE_OTHER): Payer: 59 | Admitting: Certified Nurse Midwife

## 2016-08-20 VITALS — BP 109/68 | HR 100 | Wt 155.6 lb

## 2016-08-20 DIAGNOSIS — Z3493 Encounter for supervision of normal pregnancy, unspecified, third trimester: Secondary | ICD-10-CM

## 2016-08-20 LAB — POCT URINALYSIS DIPSTICK
BILIRUBIN UA: NEGATIVE
Blood, UA: NEGATIVE
Glucose, UA: NEGATIVE
KETONES UA: NEGATIVE
LEUKOCYTES UA: NEGATIVE
Nitrite, UA: NEGATIVE
PROTEIN UA: NEGATIVE
Spec Grav, UA: 1.01 (ref 1.010–1.025)
Urobilinogen, UA: 0.2 E.U./dL
pH, UA: 6 (ref 5.0–8.0)

## 2016-08-20 NOTE — Progress Notes (Signed)
ROB-Pt doing well, reports occasional braxton hicks contractions and increase pelvic pressure. Encouraged abdominal support in pregnancy, handout given. Anticipatory guidance regarding prenatal care and 36 week cultures. Reviewed red flag symptoms and when to call. RTC x 2 weeks for ROB or sooner if needed.

## 2016-08-20 NOTE — Patient Instructions (Signed)

## 2016-09-03 ENCOUNTER — Ambulatory Visit (INDEPENDENT_AMBULATORY_CARE_PROVIDER_SITE_OTHER): Payer: 59 | Admitting: Obstetrics and Gynecology

## 2016-09-03 VITALS — BP 122/73 | HR 99 | Wt 158.3 lb

## 2016-09-03 DIAGNOSIS — Z3493 Encounter for supervision of normal pregnancy, unspecified, third trimester: Secondary | ICD-10-CM

## 2016-09-03 LAB — POCT URINALYSIS DIPSTICK
Bilirubin, UA: NEGATIVE
Blood, UA: NEGATIVE
GLUCOSE UA: NEGATIVE
Ketones, UA: NEGATIVE
Leukocytes, UA: NEGATIVE
NITRITE UA: NEGATIVE
Protein, UA: NEGATIVE
Spec Grav, UA: 1.015 (ref 1.010–1.025)
UROBILINOGEN UA: 0.2 U/dL
pH, UA: 6 (ref 5.0–8.0)

## 2016-09-03 NOTE — Progress Notes (Signed)
ROB- pt is having lots of pelvic pressure, otherwise is feeling well

## 2016-09-03 NOTE — Progress Notes (Signed)
ROB- cultures next visit, discussed [facility],

## 2016-09-10 ENCOUNTER — Encounter: Payer: Self-pay | Admitting: Certified Nurse Midwife

## 2016-09-17 ENCOUNTER — Encounter: Payer: Self-pay | Admitting: Certified Nurse Midwife

## 2016-09-17 ENCOUNTER — Ambulatory Visit (INDEPENDENT_AMBULATORY_CARE_PROVIDER_SITE_OTHER): Payer: 59 | Admitting: Certified Nurse Midwife

## 2016-09-17 VITALS — BP 106/82 | HR 82 | Wt 160.9 lb

## 2016-09-17 DIAGNOSIS — Z3493 Encounter for supervision of normal pregnancy, unspecified, third trimester: Secondary | ICD-10-CM

## 2016-09-17 DIAGNOSIS — Z369 Encounter for antenatal screening, unspecified: Secondary | ICD-10-CM

## 2016-09-17 DIAGNOSIS — Z113 Encounter for screening for infections with a predominantly sexual mode of transmission: Secondary | ICD-10-CM

## 2016-09-17 LAB — POCT URINALYSIS DIPSTICK
Bilirubin, UA: NEGATIVE
Blood, UA: NEGATIVE
GLUCOSE UA: NEGATIVE
Ketones, UA: NEGATIVE
LEUKOCYTES UA: NEGATIVE
NITRITE UA: NEGATIVE
Protein, UA: NEGATIVE
Spec Grav, UA: 1.01 (ref 1.010–1.025)
Urobilinogen, UA: 0.2 E.U./dL
pH, UA: 6 (ref 5.0–8.0)

## 2016-09-17 NOTE — Patient Instructions (Signed)
Group B Streptococcus Infection During Pregnancy Group B Streptococcus (GBS) is a type of bacteria (Streptococcus agalactiae) that is often found in healthy people, commonly in the rectum, vagina, and intestines. In people who are healthy and not pregnant, the bacteria rarely cause serious illness or complications. However, women who test positive for GBS during pregnancy can pass the bacteria to their baby during childbirth, which can cause serious infection in the baby after birth. Women with GBS may also have infections during their pregnancy or immediately after childbirth, such as such as urinary tract infections (UTIs) or infections of the uterus (uterine infections). Having GBS also increases a woman's risk of complications during pregnancy, such as early (preterm) labor or delivery, miscarriage, or stillbirth. Routine testing (screening) for GBS is recommended for all pregnant women. What increases the risk? You may have a higher risk for GBS infection during pregnancy if you had one during a past pregnancy. What are the signs or symptoms? In most cases, GBS infection does not cause symptoms in pregnant women. Signs and symptoms of a possible GBS-related infection may include:  Labor starting before the 37th week of pregnancy.  A UTI or bladder infection, which may cause: ? Fever. ? Pain or burning during urination. ? Frequent urination.  Fever during labor, along with: ? Bad-smelling discharge. ? Uterine tenderness. ? Rapid heartbeat in the mother, baby, or both.  Rare but serious symptoms of a possible GBS-related infection in women include:  Blood infection (septicemia). This may cause fever, chills, or confusion.  Lung infection (pneumonia). This may cause fever, chills, cough, rapid breathing, difficulty breathing, or chest pain.  Bone, joint, skin, or soft tissue infection.  How is this diagnosed? You may be screened for GBS between week 35 and week 37 of your pregnancy. If  you have symptoms of preterm labor, you may be screened earlier. This condition is diagnosed based on lab test results from:  A swab of fluid from the vagina and rectum.  A urine sample.  How is this treated? This condition is treated with antibiotic medicine. When you go into labor, or as soon as your water breaks (your membranes rupture), you will be given antibiotics through an IV tube. Antibiotics will continue until after you give birth. If you are having a cesarean delivery, you do not need antibiotics unless your membranes have already ruptured. Follow these instructions at home:  Take over-the-counter and prescription medicines only as told by your health care provider.  Take your antibiotic medicine as told by your health care provider. Do not stop taking the antibiotic even if you start to feel better.  Keep all pre-birth (prenatal) visits and follow-up visits as told by your health care provider. This is important. Contact a health care provider if:  You have pain or burning when you urinate.  You have to urinate frequently.  You have a fever or chills.  You develop a bad-smelling vaginal discharge. Get help right away if:  Your membranes rupture.  You go into labor.  You have severe pain in your abdomen.  You have difficulty breathing.  You have chest pain. This information is not intended to replace advice given to you by your health care provider. Make sure you discuss any questions you have with your health care provider. Document Released: 04/23/2007 Document Revised: 08/11/2015 Document Reviewed: 08/10/2015 Elsevier Interactive Patient Education  2018 [employer] Inc[employer]  

## 2016-09-17 NOTE — Progress Notes (Signed)
ROB, doing well. GBS and cultures done today. Discussed signs and symptoms of labor. Follow up in 1 wk .   Doreene Burke, CNM

## 2016-09-19 LAB — STREP GP B NAA: STREP GROUP B AG: POSITIVE — AB

## 2016-09-19 LAB — GC/CHLAMYDIA PROBE AMP
CHLAMYDIA, DNA PROBE: NEGATIVE
NEISSERIA GONORRHOEAE BY PCR: NEGATIVE

## 2016-09-20 ENCOUNTER — Encounter: Payer: Self-pay | Admitting: Certified Nurse Midwife

## 2016-09-23 ENCOUNTER — Encounter: Payer: Self-pay | Admitting: Certified Nurse Midwife

## 2016-09-24 ENCOUNTER — Encounter: Payer: Self-pay | Admitting: Certified Nurse Midwife

## 2016-09-24 ENCOUNTER — Ambulatory Visit (INDEPENDENT_AMBULATORY_CARE_PROVIDER_SITE_OTHER): Payer: 59 | Admitting: Certified Nurse Midwife

## 2016-09-24 VITALS — BP 122/79 | HR 95 | Wt 163.6 lb

## 2016-09-24 DIAGNOSIS — Z3493 Encounter for supervision of normal pregnancy, unspecified, third trimester: Secondary | ICD-10-CM

## 2016-09-24 LAB — POCT URINALYSIS DIPSTICK
Bilirubin, UA: NEGATIVE
Blood, UA: NEGATIVE
Glucose, UA: NEGATIVE
KETONES UA: NEGATIVE
LEUKOCYTES UA: NEGATIVE
Nitrite, UA: NEGATIVE
PROTEIN UA: NEGATIVE
Spec Grav, UA: 1.01 (ref 1.010–1.025)
UROBILINOGEN UA: 0.2 U/dL
pH, UA: 6 (ref 5.0–8.0)

## 2016-09-24 NOTE — Progress Notes (Signed)
ROB-Pt doing well, reports irregular contractions and pelvic pressure. Loss mucus plug this weekend. Reviewed red flag symptoms and when to call. RTC x 1 week for ROB or sooner if needed.

## 2016-09-25 ENCOUNTER — Encounter: Payer: Self-pay | Admitting: Certified Nurse Midwife

## 2016-09-29 ENCOUNTER — Inpatient Hospital Stay: Payer: Commercial Managed Care - HMO | Admitting: Anesthesiology

## 2016-09-29 ENCOUNTER — Inpatient Hospital Stay
Admission: EM | Admit: 2016-09-29 | Discharge: 2016-10-01 | DRG: 775 | Disposition: A | Discharge status: Home / Self Care | Payer: Commercial Managed Care - HMO | Attending: Obstetrics and Gynecology | Admitting: Obstetrics and Gynecology

## 2016-09-29 DIAGNOSIS — Z3A38 38 weeks gestation of pregnancy: Secondary | ICD-10-CM | POA: Diagnosis not present

## 2016-09-29 DIAGNOSIS — O99824 Streptococcus B carrier state complicating childbirth: Secondary | ICD-10-CM | POA: Diagnosis present

## 2016-09-29 DIAGNOSIS — Z3493 Encounter for supervision of normal pregnancy, unspecified, third trimester: Secondary | ICD-10-CM | POA: Diagnosis present

## 2016-09-29 LAB — CBC
HEMATOCRIT: 37.8 % (ref 35.0–47.0)
HEMOGLOBIN: 13.4 g/dL (ref 12.0–16.0)
MCH: 35 pg — ABNORMAL HIGH (ref 26.0–34.0)
MCHC: 35.6 g/dL (ref 32.0–36.0)
MCV: 98.4 fL (ref 80.0–100.0)
Platelets: 157 10*3/uL (ref 150–440)
RBC: 3.84 MIL/uL (ref 3.80–5.20)
RDW: 13.7 % (ref 11.5–14.5)
WBC: 8.4 10*3/uL (ref 3.6–11.0)

## 2016-09-29 LAB — TYPE AND SCREEN
ABO/RH(D): O POS
Antibody Screen: NEGATIVE

## 2016-09-29 MED ORDER — LACTATED RINGERS IV SOLN
500.0000 mL | INTRAVENOUS | Status: DC | PRN
  Administered 2016-09-29: 500 mL via INTRAVENOUS

## 2016-09-29 MED ORDER — BENZOCAINE-MENTHOL 20-0.5 % EX AERO: 1.0000 "application " | INHALATION_SPRAY | CUTANEOUS | Status: DC | PRN

## 2016-09-29 MED ORDER — SIMETHICONE 80 MG PO CHEW: 80.0000 mg | CHEWABLE_TABLET | ORAL | Status: DC | PRN

## 2016-09-29 MED ORDER — BUPIVACAINE HCL (PF) 0.25 % IJ SOLN
INTRAMUSCULAR | Status: DC | PRN
  Administered 2016-09-29: 10 mL via EPIDURAL

## 2016-09-29 MED ORDER — LIDOCAINE HCL (PF) 2 % IJ SOLN
INTRAMUSCULAR | Status: DC | PRN
  Administered 2016-09-29: 5 mL via INTRADERMAL

## 2016-09-29 MED ORDER — PENICILLIN G POT IN DEXTROSE 60000 UNIT/ML IV SOLN
3.0000 10*6.[IU] | INTRAVENOUS | Status: DC
  Administered 2016-09-29 (×2): 3 10*6.[IU] via INTRAVENOUS
  Filled 2016-09-29 (×8): qty 50

## 2016-09-29 MED ORDER — LIDOCAINE HCL (PF) 1 % IJ SOLN: 30.0000 mL | INTRAMUSCULAR | Status: DC | PRN

## 2016-09-29 MED ORDER — OXYTOCIN 40 UNITS IN LACTATED RINGERS INFUSION - SIMPLE MED
1.0000 m[IU]/min | INTRAVENOUS | Status: DC
  Administered 2016-09-29: 2 m[IU]/min via INTRAVENOUS
  Filled 2016-09-29: qty 1000

## 2016-09-29 MED ORDER — FENTANYL 2.5 MCG/ML W/ROPIVACAINE 0.15% IN NS 100 ML EPIDURAL (ARMC): 12.0000 mL/h | EPIDURAL | Status: DC

## 2016-09-29 MED ORDER — LIDOCAINE HCL (PF) 1 % IJ SOLN
INTRAMUSCULAR | Status: AC
  Filled 2016-09-29: qty 30

## 2016-09-29 MED ORDER — SOD CITRATE-CITRIC ACID 500-334 MG/5ML PO SOLN: 30.0000 mL | ORAL | Status: DC | PRN

## 2016-09-29 MED ORDER — METHYLERGONOVINE MALEATE 0.2 MG/ML IJ SOLN: 0.2000 mg | INTRAMUSCULAR | Status: DC | PRN

## 2016-09-29 MED ORDER — BUTORPHANOL TARTRATE 1 MG/ML IJ SOLN: 1.0000 mg | INTRAMUSCULAR | Status: DC | PRN

## 2016-09-29 MED ORDER — SENNOSIDES-DOCUSATE SODIUM 8.6-50 MG PO TABS
2.0000 | ORAL_TABLET | ORAL | Status: DC
  Administered 2016-09-30: 2 via ORAL
  Filled 2016-09-29: qty 2

## 2016-09-29 MED ORDER — ONDANSETRON HCL 4 MG/2ML IJ SOLN: 4.0000 mg | Freq: Four times a day (QID) | INTRAMUSCULAR | Status: DC | PRN

## 2016-09-29 MED ORDER — ACETAMINOPHEN 325 MG PO TABS: 650.0000 mg | ORAL_TABLET | ORAL | Status: DC | PRN

## 2016-09-29 MED ORDER — WITCH HAZEL-GLYCERIN EX PADS: 1.0000 "application " | MEDICATED_PAD | CUTANEOUS | Status: DC | PRN

## 2016-09-29 MED ORDER — FENTANYL 2.5 MCG/ML W/ROPIVACAINE 0.15% IN NS 100 ML EPIDURAL (ARMC)
EPIDURAL | Status: DC | PRN
  Administered 2016-09-29: 12 mL/h via EPIDURAL

## 2016-09-29 MED ORDER — OXYCODONE-ACETAMINOPHEN 5-325 MG PO TABS: 2.0000 | ORAL_TABLET | ORAL | Status: DC | PRN

## 2016-09-29 MED ORDER — OXYCODONE-ACETAMINOPHEN 5-325 MG PO TABS: 1.0000 | ORAL_TABLET | ORAL | Status: DC | PRN

## 2016-09-29 MED ORDER — LACTATED RINGERS IV SOLN: 500.0000 mL | Freq: Once | INTRAVENOUS | Status: DC

## 2016-09-29 MED ORDER — FENTANYL 2.5 MCG/ML W/ROPIVACAINE 0.15% IN NS 100 ML EPIDURAL (ARMC)
EPIDURAL | Status: AC
  Filled 2016-09-29: qty 100

## 2016-09-29 MED ORDER — PRENATAL MULTIVITAMIN CH
1.0000 | ORAL_TABLET | Freq: Every day | ORAL | Status: DC
  Filled 2016-09-29 (×2): qty 1

## 2016-09-29 MED ORDER — OXYTOCIN 10 UNIT/ML IJ SOLN
INTRAMUSCULAR | Status: AC
  Filled 2016-09-29: qty 2

## 2016-09-29 MED ORDER — LIDOCAINE-EPINEPHRINE (PF) 1.5 %-1:200000 IJ SOLN
INTRAMUSCULAR | Status: DC | PRN
  Administered 2016-09-29: 3 mL via PERINEURAL

## 2016-09-29 MED ORDER — AMMONIA AROMATIC IN INHA
RESPIRATORY_TRACT | Status: AC
  Filled 2016-09-29: qty 10

## 2016-09-29 MED ORDER — PHENYLEPHRINE 40 MCG/ML (10ML) SYRINGE FOR IV PUSH (FOR BLOOD PRESSURE SUPPORT)
80.0000 ug | PREFILLED_SYRINGE | INTRAVENOUS | Status: DC | PRN
  Filled 2016-09-29: qty 5

## 2016-09-29 MED ORDER — EPHEDRINE 5 MG/ML INJ
10.0000 mg | INTRAVENOUS | Status: DC | PRN
  Filled 2016-09-29: qty 2

## 2016-09-29 MED ORDER — IBUPROFEN 600 MG PO TABS
600.0000 mg | ORAL_TABLET | Freq: Four times a day (QID) | ORAL | Status: DC
  Administered 2016-09-29 – 2016-10-01 (×7): 600 mg via ORAL
  Filled 2016-09-29 (×6): qty 1

## 2016-09-29 MED ORDER — MAGNESIUM HYDROXIDE 400 MG/5ML PO SUSP: 30.0000 mL | ORAL | Status: DC | PRN

## 2016-09-29 MED ORDER — METHYLERGONOVINE MALEATE 0.2 MG PO TABS
0.2000 mg | ORAL_TABLET | ORAL | Status: DC | PRN
  Filled 2016-09-29: qty 1

## 2016-09-29 MED ORDER — LIDOCAINE HCL (PF) 1 % IJ SOLN
INTRAMUSCULAR | Status: DC | PRN
  Administered 2016-09-29: 3 mL

## 2016-09-29 MED ORDER — OXYTOCIN BOLUS FROM INFUSION
500.0000 mL | Freq: Once | INTRAVENOUS | Status: AC
  Administered 2016-09-29: 500 mL via INTRAVENOUS

## 2016-09-29 MED ORDER — OXYTOCIN 40 UNITS IN LACTATED RINGERS INFUSION - SIMPLE MED
2.5000 [IU]/h | INTRAVENOUS | Status: DC
  Administered 2016-09-29: 2.5 [IU]/h via INTRAVENOUS

## 2016-09-29 MED ORDER — DOCUSATE SODIUM 100 MG PO CAPS
100.0000 mg | ORAL_CAPSULE | Freq: Two times a day (BID) | ORAL | Status: DC
  Administered 2016-09-29 – 2016-10-01 (×3): 100 mg via ORAL
  Filled 2016-09-29 (×4): qty 1

## 2016-09-29 MED ORDER — TERBUTALINE SULFATE 1 MG/ML IJ SOLN: 0.2500 mg | Freq: Once | INTRAMUSCULAR | Status: DC | PRN

## 2016-09-29 MED ORDER — LACTATED RINGERS IV SOLN: INTRAVENOUS | Status: DC

## 2016-09-29 MED ORDER — ONDANSETRON HCL 4 MG PO TABS: 4.0000 mg | ORAL_TABLET | ORAL | Status: DC | PRN

## 2016-09-29 MED ORDER — DIPHENHYDRAMINE HCL 50 MG/ML IJ SOLN: 12.5000 mg | INTRAMUSCULAR | Status: DC | PRN

## 2016-09-29 MED ORDER — DEXTROSE 5 % IV SOLN
5.0000 10*6.[IU] | Freq: Once | INTRAVENOUS | Status: AC
  Administered 2016-09-29: 5 10*6.[IU] via INTRAVENOUS
  Filled 2016-09-29: qty 5

## 2016-09-29 MED ORDER — FERROUS SULFATE 325 (65 FE) MG PO TABS
325.0000 mg | ORAL_TABLET | Freq: Every day | ORAL | Status: DC
  Administered 2016-09-30 – 2016-10-01 (×2): 325 mg via ORAL
  Filled 2016-09-29 (×2): qty 1

## 2016-09-29 MED ORDER — DIBUCAINE 1 % RE OINT: 1.0000 "application " | TOPICAL_OINTMENT | RECTAL | Status: DC | PRN

## 2016-09-29 MED ORDER — PHENYLEPHRINE 40 MCG/ML (10ML) SYRINGE FOR IV PUSH (FOR BLOOD PRESSURE SUPPORT)
80.0000 ug | PREFILLED_SYRINGE | INTRAVENOUS | Status: DC | PRN
Start: 2016-09-29 — End: 2016-09-29
  Filled 2016-09-29: qty 5

## 2016-09-29 MED ORDER — ONDANSETRON HCL 4 MG/2ML IJ SOLN: 4.0000 mg | INTRAMUSCULAR | Status: DC | PRN

## 2016-09-29 MED ORDER — COCONUT OIL OIL
1.0000 "application " | TOPICAL_OIL | Status: DC | PRN
  Administered 2016-09-30: 1 via TOPICAL
  Filled 2016-09-29: qty 120

## 2016-09-29 NOTE — Progress Notes (Signed)
LABOR NOTE   Jenny Mcguire 30 y.o.GP@ at 446w2d Latent Labor  SUBJECTIVE:  She is becoming more uncomfortable with rectal pressure OBJECTIVE:  BP 126/82   Pulse 92   Temp 98.3 F (36.8 C) (Oral)   Resp 18   Ht 5\' 2"  (1.575 m)   Wt 160 lb (72.6 kg)   LMP 01/05/2016 (Exact Date)   BMI 29.26 kg/m  Total I/O In: 125 [I.V.:125] Out: -   She has shown cervical change. CERVIX: 5-6 cm:90%: -1/-2: mid position: soft SVE:   Dilation: 5.5 Effacement (%): 90 Station: -2, -1 Exam by:: Jenny Mcguire,CNM CONTRACTIONS: regular, every 1- 4 minutes FHR: Fetal heart tracing reviewed. Baseline: 135 bpm, Variability: Good {> 6 bpm), Accelerations: Reactive and Decelerations: Early Category I   Analgesia: Labor support without medications and requesting epidural  Labs: Lab Results  Component Value Date   WBC 8.4 09/29/2016   HGB 13.4 09/29/2016   HCT 37.8 09/29/2016   MCV 98.4 09/29/2016   PLT 157 09/29/2016    ASSESSMENT: 1) Labor curve reviewed.       Progress: Adequate uterine activity - intensity and frequency. and Satisfactory labor progress.     Membranes: ruptured, clear fluid      2)  Plaining Epidural   Active Problems:   Labor and delivery, indication for care   PLAN: continue present management, IV Pitocin augmentation and anticipate vaginal delivery  Doreene Burkennie Ski Polich, CNM 09/29/2016 11:32 AM

## 2016-09-29 NOTE — H&P (Signed)
History and Physical   HPI  Jenny Mcguire is a 31 y.o. G2P1001 at [redacted]w[redacted]d Estimated Date of Delivery: 10/11/16 who is being admitted for labor management. She came in for painful contractions every 2-4 minutes. She endorses good fetal movement and denies bleeding or LOF.    OB History  Obstetric History   G2   P1   T1   P0   A0   L1    SAB0   TAB0   Ectopic0   Multiple0   Live Births1     # Outcome Date GA Lbr Len/2nd Weight Sex Delivery Anes PTL Lv  2 Current           1 Term 2011    F Vag-Spont  N LIV      PROBLEM LIST  Pregnancy complications or risks: Patient Active Problem List   Diagnosis Date Noted  . Labor and delivery, indication for care 09/29/2016  . Echogenic intracardiac focus of fetus on prenatal ultrasound   . Pregnancy 05/22/2016    Prenatal labs and studies: ABO, Rh: --/--/O POS (09/02 4098) Antibody: NEG (09/02 0317) Rubella: 2.91 (01/31 0940) RPR: Non Reactive (01/31 0940)  HBsAg: Negative (01/31 0940)  HIV:   Non Reactive [id number]:[id number] (08/21 1135) 1 hr Glucola  125 Genetic screening normal Anatomy US intracardiac echogenic focus  Prenatal Transfer Tool  Maternal Diabetes: No Genetic Screening: Normal Maternal Ultrasounds/Referrals: Abnormal:  Findings:   Other: intracardiac echogenic focus.  In isolation (after a  detailed ultrasound) and in the setting of normal aneuploidy  screening the finding is not an indication for further testing.  The finding represents a normal variant and is not associated  wtih abnormalities of fetal cardiac function or structure. Fetal Ultrasounds or other Referrals:  Referred to Materal Fetal Medicine  Maternal Substance Abuse:  No Significant Maternal Medications:  None Significant Maternal Lab Results: Lab values include: Group B Strep positive   Past Medical History:  Diagnosis Date  . Amenorrhea   . History of UTI      Past Surgical History:  Procedure Laterality Date  . BREAST ENHANCEMENT SURGERY        Medications    Current Discharge Medication List    CONTINUE these medications which have NOT CHANGED   Details  Prenatal Vit-Fe Fumarate-FA (PRENATAL VITAMINS PLUS PO) Take by mouth.         Allergies  Sulfa antibiotics  Review of Systems  Constitutional: negative Eyes: negative Ears, nose, mouth, throat, and face: negative Respiratory: negative Cardiovascular: negative Gastrointestinal: negative Genitourinary:negative Integument/breast: negative Hematologic/lymphatic: negative Musculoskeletal:negative Neurological: negative Behavioral/Psych: negative Endocrine: negative  Physical Exam  BP 124/90 (BP Location: Left Arm)   Pulse (!) 101   Temp 98.1 F (36.7 C) (Oral)   Resp 18   Ht 5\' 2"  (1.575 m)   Wt 160 lb (72.6 kg)   LMP 01/05/2016 (Exact Date)   BMI 29.26 kg/m   Lungs:  CTA B Cardio: RRR without M/R/G Abd: Soft, gravid, NT Presentation: cephalic EXT: No C/C/ 1+ Edema DTRs: 2+ B CERVIX: 5 cm:70%: -2: mid position: firm  See Prenatal records for more detailed PE.     FHR:  Baseline: 145 bpm, Variability: Good {> 6 bpm), Accelerations: Reactive and Decelerations: Absent  Toco: Uterine Contractions: Frequency: Every 2-4 minutes, Duration: 50-80 seconds and Intensity: moderate   Test Results  Results for orders placed or performed during the hospital encounter of 09/29/16 (from the past 24 hour(s))  CBC  Status: Abnormal   Collection Time: 09/29/16  3:16 AM  Result Value Ref Range   WBC 8.4 3.6 - 11.0 K/uL   RBC 3.84 3.80 - 5.20 MIL/uL   Hemoglobin 13.4 12.0 - 16.0 g/dL   HCT 96.037.8 45.435.0 - 09.847.0 %   MCV 98.4 80.0 - 100.0 fL   MCH 35.0 (H) 26.0 - 34.0 pg   MCHC 35.6 32.0 - 36.0 g/dL   RDW 11.913.7 14.711.5 - 82.914.5 %   Platelets 157 150 - 440 K/uL  Type and screen [facility]     Status: None   Collection Time: 09/29/16  3:17 AM  Result Value Ref Range   ABO/RH(D) O POS    Antibody Screen NEG    Sample Expiration  10/02/2016    Group B Strep positive  Assessment   G2P1001 at 3371w2d Estimated Date of Delivery: 10/11/16  The fetus is reassuring. Reactive NST  Patient Active Problem List   Diagnosis Date Noted  . Labor and delivery, indication for care 09/29/2016  . Echogenic intracardiac focus of fetus on prenatal ultrasound   . Pregnancy 05/22/2016    Plan  1. Admit to L&D for continue present management and GBS prophylaxis  2. EFM:-- Category 1 3. Epidural if desired. Stadol for IV pain until epidural requested. 4. Admission labs  5. AROM- clear fluid  6. Anticipate NSVD  Doreene Burkennie Friend Dorfman, CNM 09/29/2016 5:51 AM

## 2016-09-29 NOTE — Anesthesia Procedure Notes (Signed)
Epidural Patient location during procedure: OB Start time: 09/29/2016 11:47 AM End time: 09/29/2016 11:55 AM  Staffing Anesthesiologist: Yves DillARROLL, Ruchama Kubicek Performed: anesthesiologist   Preanesthetic Checklist Completed: patient identified, site marked, surgical consent, pre-op evaluation, timeout performed, IV checked, risks and benefits discussed and monitors and equipment checked  Epidural Patient position: sitting Prep: Betadine Patient monitoring: heart rate, continuous pulse ox and blood pressure Approach: midline Location: L4-L5 Injection technique: LOR air  Needle:  Needle type: Tuohy  Needle gauge: 17 G Needle length: 9 cm and 9 Catheter type: closed end flexible Catheter size: 19 Gauge Test dose: negative and 1.5% lidocaine with Epi 1:200 K  Assessment Events: blood not aspirated, injection not painful, no injection resistance, negative IV test and no paresthesia  Additional Notes Time ouit called.  Patient placed in sitting position.  Back prepped and draped in sterile fashion.  A skin wheal was made in the L3-L4 interspace with 1% Lidocaine plain.  A 17G tuohy needle was advanced into the epidural space by a loss of resistance technique.  The epidural catheter was threaded 3 cm and the TD was negative.  No blood or paresthesias.  The catheter was affixed to the back in sterile fashion.  The patient tolerated the procedure well.Reason for block:procedure for pain

## 2016-09-29 NOTE — Anesthesia Preprocedure Evaluation (Signed)
Anesthesia Evaluation  Patient identified by MRN, date of birth, ID band Patient awake    Reviewed: Allergy & Precautions, NPO status , Patient's Chart, lab work & pertinent test results  Airway Mallampati: II  TM Distance: >3 FB     Dental  (+) Teeth Intact   Pulmonary neg pulmonary ROS,    Pulmonary exam normal        Cardiovascular negative cardio ROS Normal cardiovascular exam     Neuro/Psych negative neurological ROS  negative psych ROS   GI/Hepatic negative GI ROS, Neg liver ROS,   Endo/Other  negative endocrine ROS  Renal/GU negative Renal ROS  negative genitourinary   Musculoskeletal negative musculoskeletal ROS (+)   Abdominal Normal abdominal exam  (+)   Peds negative pediatric ROS (+)  Hematology negative hematology ROS (+)   Anesthesia Other Findings   Reproductive/Obstetrics (+) Pregnancy                             Anesthesia Physical Anesthesia Plan  ASA: II  Anesthesia Plan: Epidural   Post-op Pain Management:    Induction:   PONV Risk Score and Plan:   Airway Management Planned: Natural Airway  Additional Equipment:   Intra-op Plan:   Post-operative Plan:   Informed Consent: I have reviewed the patients History and Physical, chart, labs and discussed the procedure including the risks, benefits and alternatives for the proposed anesthesia with the patient or authorized representative who has indicated his/her understanding and acceptance.   Dental advisory given  Plan Discussed with: CRNA and Surgeon  Anesthesia Plan Comments:         Anesthesia Quick Evaluation  

## 2016-09-29 NOTE — OB Triage Note (Signed)
Patient came c/o contractions since 2030. Contractions since then have been constant and more intense. Rating pain 4 out of 10 in lower abdomen. Denies bleeding and denies LOF.Positive FM.

## 2016-09-29 NOTE — Progress Notes (Signed)
LABOR NOTE   Jenny BergerLindsay R Mcguire 31 y.o.GP@ at 6163w2d Early latent labor.  SUBJECTIVE:  She presented today with with painful contractions and made cervical change. OBJECTIVE:  BP 126/85 (BP Location: Left Arm)   Pulse 92   Temp 98 F (36.7 C) (Oral)   Resp 18   Ht 5\' 2"  (1.575 m)   Wt 160 lb (72.6 kg)   LMP 01/05/2016 (Exact Date)   BMI 29.26 kg/m  No intake/output data recorded.  She has shown cervical change. CERVIX: 5-6 cm :70%: -3: anterior: firm SVE:   Dilation: 5 Effacement (%): 70 Station: -2 Exam by:: Doreene BurkeAnnie Tanecia Mccay, CNM CONTRACTIONS: irregular, every 1-9 minutes FHR: Fetal heart tracing reviewed. Baseline: 145 bpm, Variability: Good {> 6 bpm), Accelerations: Reactive and Decelerations: Absent Category I   Analgesia: Labor support without medications  Labs: Lab Results  Component Value Date   WBC 8.4 09/29/2016   HGB 13.4 09/29/2016   HCT 37.8 09/29/2016   MCV 98.4 09/29/2016   PLT 157 09/29/2016    ASSESSMENT: 1) Labor curve reviewed.       Progress: Early latent labor., Inadequate uterine activity - intensity or frequency. and Dysfunctional uterine contractions.     Membranes: ruptured, clear fluid       Active Problems:   Labor and delivery, indication for care   PLAN: IV Pitocin augmentation and anticipate vaginal delivery Doreene Burkennie Deronte Solis, CNM 09/29/2016 8:36 AM

## 2016-09-30 LAB — CBC
HEMATOCRIT: 36.1 % (ref 35.0–47.0)
HEMOGLOBIN: 12.9 g/dL (ref 12.0–16.0)
MCH: 35.1 pg — ABNORMAL HIGH (ref 26.0–34.0)
MCHC: 35.8 g/dL (ref 32.0–36.0)
MCV: 98 fL (ref 80.0–100.0)
Platelets: 148 10*3/uL — ABNORMAL LOW (ref 150–440)
RBC: 3.68 MIL/uL — ABNORMAL LOW (ref 3.80–5.20)
RDW: 13.8 % (ref 11.5–14.5)
WBC: 10.2 10*3/uL (ref 3.6–11.0)

## 2016-09-30 LAB — RPR: RPR: NONREACTIVE

## 2016-09-30 NOTE — Progress Notes (Signed)
Progress Note - Vaginal Delivery  Jenny BergerLindsay R Mcguire is a 31 y.o. [phone] now PP day 1 s/p Vaginal, Spontaneous Delivery .   Subjective:  The patient reports no complaints, up ad lib, voiding, tolerating PO and + flatus  Objective:  Vital signs in last 24 hours: Temp:  [97.5 F (36.4 C)-98.4 F (36.9 C)] 97.9 F (36.6 C) (09/03 0755) Pulse Rate:  [75-138] 77 (09/03 0755) Resp:  [17-20] 17 (09/03 0755) BP: (103-146)/(61-96) 114/82 (09/03 0755) SpO2:  [98 %-100 %] 100 % (09/03 0755)  Physical Exam:  General: alert, cooperative and appears stated age  Lungs: clear bilaterally Heart: RRR Lochia: appropriate Uterine Fundus: firm U-2 DVT Evaluation: No evidence of DVT seen on physical exam. Negative Homan's sign. No cords or calf tenderness. No significant calf/ankle edema.    Data Review  Recent Labs  09/29/16 0316 09/30/16 0518  HGB 13.4 12.9  HCT 37.8 36.1    Assessment/Plan: Active Problems:   Labor and delivery, indication for care   Plan for discharge tomorrow and Breastfeeding   -- Continue routine PP care.     Doreene Burkennie Rohen Kimes, CNM 09/30/2016 11:02 AM

## 2016-09-30 NOTE — Anesthesia Postprocedure Evaluation (Signed)
Anesthesia Post Note  Patient: Jenny Mcguire [name] Procedure(s) Performed: * No procedures listed *  Patient location during evaluation: Mother Baby Anesthesia Type: Epidural Level of consciousness: awake and alert Pain management: pain level controlled Vital Signs Assessment: post-procedure vital signs reviewed and stable Respiratory status: spontaneous breathing, nonlabored ventilation and respiratory function stable Cardiovascular status: stable Postop Assessment: no headache, no backache, epidural receding, no signs of nausea or vomiting and adequate PO intake Anesthetic complications: no     Last Vitals:  Vitals:   09/30/16 0007 09/30/16 0355  BP: 104/71 103/63  Pulse: 75 84  Resp: 18 18  Temp: (!) 36.4 C 36.9 C  SpO2: 99% 99%    Last Pain:  Vitals:   09/30/16 0640  TempSrc:   PainSc: 3                  Gid Schoffstall,  Alessandra BevelsJennifer M

## 2016-10-01 ENCOUNTER — Encounter: Payer: 59 | Admitting: Obstetrics and Gynecology

## 2016-10-01 MED ORDER — IBUPROFEN 600 MG PO TABS: 600.0000 mg | ORAL_TABLET | Freq: Four times a day (QID) | ORAL | 0 refills | Status: DC

## 2016-10-01 MED ORDER — DOCUSATE SODIUM 100 MG PO CAPS: 100.0000 mg | ORAL_CAPSULE | Freq: Two times a day (BID) | ORAL | 0 refills | Status: DC

## 2016-10-01 NOTE — Progress Notes (Signed)
Patient discharged home with infant and significant other. Discharge instructions, prescriptions and follow up appointment given to and reviewed with patient and significant other. Patient verbalized understanding. Escorted out via wheelchair by auxiliary.  

## 2016-10-01 NOTE — Final Progress Note (Signed)
                     Discharge Day SOAP Note:  Progress Note - Vaginal Delivery  Jenny Mcguire is a 31 y.o. [id number] now PP day 2 s/p Vaginal, Spontaneous Delivery . Delivery was uncomplicated  Subjective  The patient has the following complaints: has no unusual complaints  Pain is controlled with current medications.   Patient is urinating without difficulty.  She is ambulating well.    Objective  Vital signs: BP 115/84 (BP Location: Left Arm)   Pulse 85   Temp 98.1 F (36.7 C) (Oral)   Resp 18   Ht 5\' 2"  (1.575 m)   Wt 160 lb (72.6 kg)   LMP 01/05/2016 (Exact Date)   SpO2 99%   Breastfeeding? Unknown   BMI 29.26 kg/m   Physical Exam: Gen: NAD Lungs clear bilaterally Heart: RRR No signs of DVT Fundus Fundal Tone: Firm   Lochia Amount: Small  Perineum Appearance: Intact     Data Review Labs: CBC Latest Ref Rng & Units 09/30/2016 09/29/2016 07/23/2016  WBC 3.6 - 11.0 K/uL 10.2 8.4 -  Hemoglobin 12.0 - 16.0 g/dL 45.412.9 09.813.4 11.912.0  Hematocrit 35.0 - 47.0 % 36.1 37.8 34.6  Platelets 150 - 440 K/uL 148(L) 157 -   O POS  Assessment/Plan  Active Problems:   Labor and delivery, indication for care    Plan for discharge today.   Discharge Instructions: Per After Visit Summary. Activity: Advance as tolerated. Pelvic rest for 6 weeks.  Also refer to After Visit Summary Diet: Regular Medications: Allergies as of 10/01/2016      Reactions   Sulfa Antibiotics       Medication List    TAKE these medications   docusate sodium 100 MG capsule Commonly known as:  COLACE Take 1 capsule (100 mg total) by mouth 2 (two) times daily.   ibuprofen 600 MG tablet Commonly known as:  ADVIL,MOTRIN Take 1 tablet (600 mg total) by mouth every 6 (six) hours.   PRENATAL VITAMINS PLUS PO Take by mouth.            Discharge Care Instructions        Start     Ordered   10/01/16 0000  docusate sodium (COLACE) 100 MG capsule  2 times daily     10/01/16 0821   10/01/16 0000   ibuprofen (ADVIL,MOTRIN) 600 MG tablet  Every 6 hours     10/01/16 0821   09/29/16 0000  OB RESULTS CONSOLE Varicella zoster antibody, IgG    Comments:  This external order was created through the Results Console.    09/29/16 0646     Outpatient follow up:  Postpartum contraception: vasectomy  Discharged Condition: good  Discharged to: home  Newborn Data: Disposition:home with mother  Apgars: APGAR (1 MIN): 8   APGAR (5 MINS): 8   APGAR (10 MINS):    Baby Feeding: Breast    Doreene Burkennie Kelly Eisler, CNM 10/01/2016 8:21 AM

## 2016-10-01 NOTE — Discharge Summary (Signed)
Discharge Summary  Date of Admission: 09/29/2016  Date of Discharge: 10/01/16  Admitting Diagnosis: Onset of Labor at 5441w2d  Secondary Diagnosis: None  Mode of Delivery: normal spontaneous vaginal delivery     Discharge Diagnosis: No other diagnosis   Intrapartum Procedures: epidural, GBS prophylaxis and pitocin augmentation   Post partum procedures: None  Complications: none                      Discharge Day SOAP Note:  Progress Note - Vaginal Delivery  Jenny Mcguire is a 31 y.o. [id number] now PP day 2 s/p Vaginal, Spontaneous Delivery . Delivery was uncomplicated  Subjective  The patient has the following complaints: has no unusual complaints  Pain is controlled with current medications.   Patient is urinating without difficulty.  She is ambulating well.    Objective  Vital signs: BP 115/84 (BP Location: Left Arm)   Pulse 85   Temp 98.1 F (36.7 C) (Oral)   Resp 18   Ht 5\' 2"  (1.575 m)   Wt 160 lb (72.6 kg)   LMP 01/05/2016 (Exact Date)   SpO2 99%   Breastfeeding? Unknown   BMI 29.26 kg/m   Physical Exam: Gen: NAD Lungs clear bilaterally Heart: RRR No signs of DVT Fundus Fundal Tone: Firm   Lochia Amount: Small  Perineum Appearance: Intact     Data Review Labs: CBC Latest Ref Rng & Units 09/30/2016 09/29/2016 07/23/2016  WBC 3.6 - 11.0 K/uL 10.2 8.4 -  Hemoglobin 12.0 - 16.0 g/dL 45.412.9 09.813.4 11.912.0  Hematocrit 35.0 - 47.0 % 36.1 37.8 34.6  Platelets 150 - 440 K/uL 148(L) 157 -   O POS  Assessment/Plan  Active Problems:   Labor and delivery, indication for care    Plan for discharge today.   Discharge Instructions: Per After Visit Summary. Activity: Advance as tolerated. Pelvic rest for 6 weeks.  Also refer to After Visit Summary Diet: Regular Medications: Allergies as of 10/01/2016      Reactions   Sulfa Antibiotics       Medication List    TAKE these medications   docusate sodium 100 MG capsule Commonly known  as:  COLACE Take 1 capsule (100 mg total) by mouth 2 (two) times daily.   ibuprofen 600 MG tablet Commonly known as:  ADVIL,MOTRIN Take 1 tablet (600 mg total) by mouth every 6 (six) hours.   PRENATAL VITAMINS PLUS PO Take by mouth.            Discharge Care Instructions        Start     Ordered   10/01/16 0000  docusate sodium (COLACE) 100 MG capsule  2 times daily     10/01/16 0821   10/01/16 0000  ibuprofen (ADVIL,MOTRIN) 600 MG tablet  Every 6 hours     10/01/16 0821   09/29/16 0000  OB RESULTS CONSOLE Varicella zoster antibody, IgG    Comments:  This external order was created through the Results Console.    09/29/16 0646     Outpatient follow up:  Postpartum contraception: vasectomy  Discharged Condition: good  Discharged to: home  Newborn Data: Disposition:home with mother  Apgars: APGAR (1 MIN): 8   APGAR (5 MINS): 8   APGAR (10 MINS):    Baby Feeding: Breast    Doreene Burkennie Birtie Fellman, CNM 10/01/2016 8:21 AM

## 2016-10-07 ENCOUNTER — Encounter: Payer: Self-pay | Admitting: Certified Nurse Midwife

## 2016-10-11 ENCOUNTER — Encounter: Payer: BLUE CROSS/BLUE SHIELD | Admitting: Obstetrics and Gynecology

## 2016-10-15 ENCOUNTER — Encounter: Payer: Self-pay | Admitting: Certified Nurse Midwife

## 2016-11-11 ENCOUNTER — Encounter: Payer: Self-pay | Admitting: Certified Nurse Midwife

## 2016-11-19 ENCOUNTER — Ambulatory Visit (INDEPENDENT_AMBULATORY_CARE_PROVIDER_SITE_OTHER): Payer: 59 | Admitting: Certified Nurse Midwife

## 2016-11-19 ENCOUNTER — Encounter: Payer: Self-pay | Admitting: Certified Nurse Midwife

## 2016-11-19 NOTE — Patient Instructions (Signed)
Preventive Care 31-39 Years, Female Preventive care refers to lifestyle choices and visits with your health care provider that can promote health and wellness. What does preventive care include?  A yearly physical exam. This is also called an annual well check.  Dental exams once or twice a year.  Routine eye exams. Ask your health care provider how often you should have your eyes checked.  Personal lifestyle choices, including: ? Daily care of your teeth and gums. ? Regular physical activity. ? Eating a healthy diet. ? Avoiding tobacco and drug use. ? Limiting alcohol use. ? Practicing safe sex. ? Taking vitamin and mineral supplements as recommended by your health care provider. What happens during an annual well check? The services and screenings done by your health care provider during your annual well check will depend on your age, overall health, lifestyle risk factors, and family history of disease. Counseling Your health care provider may ask you questions about your:  Alcohol use.  Tobacco use.  Drug use.  Emotional well-being.  Home and relationship well-being.  Sexual activity.  Eating habits.  Work and work Statistician.  Method of birth control.  Menstrual cycle.  Pregnancy history.  Screening You may have the following tests or measurements:  Height, weight, and BMI.  Diabetes screening. This is done by checking your blood sugar (glucose) after you have not eaten for a while (fasting).  Blood pressure.  Lipid and cholesterol levels. These may be checked every 5 years starting at age 66.  Skin check.  Hepatitis C blood test.  Hepatitis B blood test.  Sexually transmitted disease (STD) testing.  BRCA-related cancer screening. This may be done if you have a family history of breast, ovarian, tubal, or peritoneal cancers.  Pelvic exam and Pap test. This may be done every 3 years starting at age 40. Starting at age 59, this may be done every 5  years if you have a Pap test in combination with an HPV test.  Discuss your test results, treatment options, and if necessary, the need for more tests with your health care provider. Vaccines Your health care provider may recommend certain vaccines, such as:  Influenza vaccine. This is recommended every year.  Tetanus, diphtheria, and acellular pertussis (Tdap, Td) vaccine. You may need a Td booster every 10 years.  Varicella vaccine. You may need this if you have not been vaccinated.  HPV vaccine. If you are 69 or younger, you may need three doses over 6 months.  Measles, mumps, and rubella (MMR) vaccine. You may need at least one dose of MMR. You may also need a second dose.  Pneumococcal 13-valent conjugate (PCV13) vaccine. You may need this if you have certain conditions and were not previously vaccinated.  Pneumococcal polysaccharide (PPSV23) vaccine. You may need one or two doses if you smoke cigarettes or if you have certain conditions.  Meningococcal vaccine. One dose is recommended if you are age 27-21 years and a first-year college student living in a residence hall, or if you have one of several medical conditions. You may also need additional booster doses.  Hepatitis A vaccine. You may need this if you have certain conditions or if you travel or work in places where you may be exposed to hepatitis A.  Hepatitis B vaccine. You may need this if you have certain conditions or if you travel or work in places where you may be exposed to hepatitis B.  Haemophilus influenzae type b (Hib) vaccine. You may need this if  you have certain risk factors.  Talk to your health care provider about which screenings and vaccines you need and how often you need them. This information is not intended to replace advice given to you by your health care provider. Make sure you discuss any questions you have with your health care provider. Document Released: 03/12/2001 Document Revised: 10/04/2015  Document Reviewed: 11/15/2014 Elsevier Interactive Patient Education  2017 [employer].

## 2016-11-19 NOTE — Progress Notes (Signed)
Subjective:    Jenny Mcguire is a 31 y.o. [id number] Caucasian female who presents for a postpartum visit. She is 6 weeks postpartum following a spontaneous vaginal delivery at 38 gestational weeks. Anesthesia: epidural. I have fully reviewed the prenatal and intrapartum course. Postpartum course has been normal. Baby's course has been normal. Baby is feeding by breast. Bleeding no bleeding. Bowel function is normal. Bladder function is normal. Patient is not sexually active. Last sexual activity: prior to delivery Contraception method is none and husband planning a vasectomy . Postpartum depression screening: negative. Score 1.  Last pap 09/28/2014 and was negative.  The following portions of the patient's history were reviewed and updated as appropriate: allergies, current medications, past medical history, past surgical history and problem list.  Review of Systems Pertinent items are noted in HPI.   Vitals:   11/19/16 1544  BP: 117/74  Pulse: 93  Weight: 149 lb (67.6 kg)  Height: 5\' 2"  (1.575 m)   No LMP recorded.  Objective:   General:  alert, cooperative and no distress   Breasts:  deferred, no complaints  Lungs: clear to auscultation bilaterally  Heart:  regular rate and rhythm  Abdomen: soft, nontender   Vulva: normal  Vagina: normal vagina  Cervix:  closed  Corpus: Well-involuted  Adnexa:  Non-palpable  Rectal Exam: no hemorrhoids        Assessment:   Postpartum exam 6 wks s/p NSVD Breastfeeding Depression screening negative Contraception counseling reviewed.   Plan:  : none . Pt counseled on potiential to get pregnant. Encouraged her to let me know if she needs until her husband has vasectomy.  Follow up in: 1 yr for annual or earlier if needed  Doreene BurkeAnnie Reshma Hoey, CNM

## 2016-11-20 ENCOUNTER — Encounter: Payer: Self-pay | Admitting: Certified Nurse Midwife

## 2016-12-04 ENCOUNTER — Encounter: Payer: Self-pay | Admitting: Certified Nurse Midwife

## 2017-02-12 ENCOUNTER — Encounter: Payer: Self-pay | Admitting: Certified Nurse Midwife

## 2017-02-12 ENCOUNTER — Ambulatory Visit (INDEPENDENT_AMBULATORY_CARE_PROVIDER_SITE_OTHER): Payer: BLUE CROSS/BLUE SHIELD | Admitting: Certified Nurse Midwife

## 2017-02-12 VITALS — HR 108 | Ht 62.0 in | Wt 148.8 lb

## 2017-02-12 DIAGNOSIS — Z0000 Encounter for general adult medical examination without abnormal findings: Secondary | ICD-10-CM | POA: Diagnosis not present

## 2017-02-12 NOTE — Patient Instructions (Signed)
Preventive Care 18-39 Years, Female Preventive care refers to lifestyle choices and visits with your health care provider that can promote health and wellness. What does preventive care include?  A yearly physical exam. This is also called an annual well check.  Dental exams once or twice a year.  Routine eye exams. Ask your health care provider how often you should have your eyes checked.  Personal lifestyle choices, including: ? Daily care of your teeth and gums. ? Regular physical activity. ? Eating a healthy diet. ? Avoiding tobacco and drug use. ? Limiting alcohol use. ? Practicing safe sex. ? Taking vitamin and mineral supplements as recommended by your health care provider. What happens during an annual well check? The services and screenings done by your health care provider during your annual well check will depend on your age, overall health, lifestyle risk factors, and family history of disease. Counseling Your health care provider may ask you questions about your:  Alcohol use.  Tobacco use.  Drug use.  Emotional well-being.  Home and relationship well-being.  Sexual activity.  Eating habits.  Work and work Statistician.  Method of birth control.  Menstrual cycle.  Pregnancy history.  Screening You may have the following tests or measurements:  Height, weight, and BMI.  Diabetes screening. This is done by checking your blood sugar (glucose) after you have not eaten for a while (fasting).  Blood pressure.  Lipid and cholesterol levels. These may be checked every 5 years starting at age 66.  Skin check.  Hepatitis C blood test.  Hepatitis B blood test.  Sexually transmitted disease (STD) testing.  BRCA-related cancer screening. This may be done if you have a family history of breast, ovarian, tubal, or peritoneal cancers.  Pelvic exam and Pap test. This may be done every 3 years starting at age 40. Starting at age 59, this may be done every 5  years if you have a Pap test in combination with an HPV test.  Discuss your test results, treatment options, and if necessary, the need for more tests with your health care provider. Vaccines Your health care provider may recommend certain vaccines, such as:  Influenza vaccine. This is recommended every year.  Tetanus, diphtheria, and acellular pertussis (Tdap, Td) vaccine. You may need a Td booster every 10 years.  Varicella vaccine. You may need this if you have not been vaccinated.  HPV vaccine. If you are 69 or younger, you may need three doses over 6 months.  Measles, mumps, and rubella (MMR) vaccine. You may need at least one dose of MMR. You may also need a second dose.  Pneumococcal 13-valent conjugate (PCV13) vaccine. You may need this if you have certain conditions and were not previously vaccinated.  Pneumococcal polysaccharide (PPSV23) vaccine. You may need one or two doses if you smoke cigarettes or if you have certain conditions.  Meningococcal vaccine. One dose is recommended if you are age 27-21 years and a first-year college student living in a residence hall, or if you have one of several medical conditions. You may also need additional booster doses.  Hepatitis A vaccine. You may need this if you have certain conditions or if you travel or work in places where you may be exposed to hepatitis A.  Hepatitis B vaccine. You may need this if you have certain conditions or if you travel or work in places where you may be exposed to hepatitis B.  Haemophilus influenzae type b (Hib) vaccine. You may need this if  you have certain risk factors.  Talk to your health care provider about which screenings and vaccines you need and how often you need them. This information is not intended to replace advice given to you by your health care provider. Make sure you discuss any questions you have with your health care provider. Document Released: 03/12/2001 Document Revised: 10/04/2015  Document Reviewed: 11/15/2014 Elsevier Interactive Patient Education  [employer].

## 2017-02-12 NOTE — Addendum Note (Signed)
Addended by: Rosine BeatLONTZ, AMY L on: 02/12/2017 03:15 PM   Modules accepted: Orders

## 2017-02-12 NOTE — Progress Notes (Signed)
GYNECOLOGY ANNUAL PREVENTATIVE CARE ENCOUNTER NOTE  Subjective:   Jenny BergerLindsay R Mcguire is a 32 y.o. [id number] female here for a routine annual gynecologic exam.  Current complaints: none.   Denies abnormal vaginal bleeding, discharge, pelvic pain, problems with intercourse or other gynecologic concerns.    Gynecologic History No LMP recorded. Contraception: condoms, her husband is going to get vasectomy Last Pap: 2016. Results were: normal Last mammogram: n/a.   Obstetric History OB History  Gravida Para Term Preterm AB Living  2 2 2     2   SAB TAB Ectopic Multiple Live Births        0 2    # Outcome Date GA Lbr Len/2nd Weight Sex Delivery Anes PTL Lv  2 Term 09/29/16 472w2d / 00:12 7 lb 0.2 oz (3.18 kg) F Vag-Spont EPI  LIV  1 Term 2011    F Vag-Spont  N LIV      Past Medical History:  Diagnosis Date  . Amenorrhea   . History of UTI     Past Surgical History:  Procedure Laterality Date  . BREAST ENHANCEMENT SURGERY      Current Outpatient Medications on File Prior to Visit  Medication Sig Dispense Refill  . Prenatal Vit-Fe Fumarate-FA (PRENATAL VITAMINS PLUS PO) Take by mouth.     No current facility-administered medications on file prior to visit.     Allergies  Allergen Reactions  . Sulfa Antibiotics     Social History   Socioeconomic History  . Marital status: Married    Spouse name: Not on file  . Number of children: Not on file  . Years of education: Not on file  . Highest education level: Not on file  Social Needs  . Financial resource strain: Not on file  . Food insecurity - worry: Not on file  . Food insecurity - inability: Not on file  . Transportation needs - medical: Not on file  . Transportation needs - non-medical: Not on file  Occupational History  . Not on file  Tobacco Use  . Smoking status: Never Smoker  . Smokeless tobacco: Never Used  Substance and Sexual Activity  . Alcohol use: Yes    Comment: occas/ not during pregnancy  . Drug  use: No  . Sexual activity: Yes  Other Topics Concern  . Not on file  Social History Narrative  . Not on file    Family History  Problem Relation Age of Onset  . Cancer Neg Hx   . Heart failure Neg Hx   . Diabetes Neg Hx     The following portions of the patient's history were reviewed and updated as appropriate: allergies, current medications, past family history, past medical history, past social history, past surgical history and problem list.  Review of Systems Constitutional: negative Eyes: negative Ears, nose, mouth, throat, and face: negative Respiratory: negative Cardiovascular: negative Gastrointestinal: negative Genitourinary:negative Integument/breast: negative Hematologic/lymphatic: negative Musculoskeletal:negative Neurological: negative Behavioral/Psych: negative Endocrine: negative Allergic/Immunologic: negative   Objective:  Pulse (!) 108   Ht 5\' 2"  (1.575 m)   Wt 148 lb 12.8 oz (67.5 kg)   Breastfeeding? Yes   BMI 27.22 kg/m  CONSTITUTIONAL: Well-developed, well-nourished female in no acute distress.  HENT:  Normocephalic, atraumatic, External right and left ear normal. Oropharynx is clear and moist EYES: Conjunctivae and EOM are normal. Pupils are equal, round, and reactive to light. No scleral icterus.  NECK: Normal range of motion, supple, no masses.  Normal thyroid.  SKIN: Skin is warm  and dry. No rash noted. Not diaphoretic. No erythema. No pallor. NEUROLOGIC: Alert and oriented to person, place, and time. Normal reflexes, muscle tone coordination. No cranial nerve deficit noted. PSYCHIATRIC: Normal mood and affect. Normal behavior. Normal judgment and thought content. CARDIOVASCULAR: Normal heart rate noted, regular rhythm RESPIRATORY: Clear to auscultation bilaterally. Effort and breath sounds normal, no problems with respiration noted. BREASTS: Symmetric in size. No masses, skin changes, nipple drainage, or lymphadenopathy. Nursing  ABDOMEN:  Soft, normal bowel sounds, no distention noted.  No tenderness, rebound or guarding.  PELVIC: Normal appearing external genitalia; normal appearing vaginal mucosa and cervix.  No abnormal discharge noted.  Pap smear obtained.  Normal uterine size, no other palpable masses, no uterine or adnexal tenderness. MUSCULOSKELETAL: Normal range of motion. No tenderness.  No cyanosis, clubbing, or edema.  2+ distal pulses.   Assessment and Plan:  Well women physical exam  Will follow up results of pap smear and manage accordingly. Mammogram not indicated Routine preventative health maintenance measures emphasized. Please refer to After Visit Summary for other counseling recommendations.    Doreene Burke, CNM

## 2017-02-14 LAB — PAP IG W/ RFLX HPV ASCU: PAP Smear Comment: 0

## 2017-02-15 ENCOUNTER — Encounter: Payer: Self-pay | Admitting: Certified Nurse Midwife

## 2017-02-25 ENCOUNTER — Encounter: Payer: Self-pay | Admitting: Certified Nurse Midwife

## 2017-03-07 ENCOUNTER — Encounter: Payer: Self-pay | Admitting: Certified Nurse Midwife

## 2017-05-02 ENCOUNTER — Encounter: Payer: Self-pay | Admitting: Certified Nurse Midwife

## 2017-07-25 ENCOUNTER — Encounter: Payer: Self-pay | Admitting: Obstetrics and Gynecology

## 2017-12-23 ENCOUNTER — Telehealth (INDEPENDENT_AMBULATORY_CARE_PROVIDER_SITE_OTHER): Payer: BLUE CROSS/BLUE SHIELD

## 2017-12-23 ENCOUNTER — Telehealth: Payer: Self-pay | Admitting: Certified Nurse Midwife

## 2017-12-23 DIAGNOSIS — R309 Painful micturition, unspecified: Secondary | ICD-10-CM | POA: Diagnosis not present

## 2017-12-23 LAB — POCT URINALYSIS DIPSTICK
Bilirubin, UA: NEGATIVE
Blood, UA: NEGATIVE
Glucose, UA: NEGATIVE
KETONES UA: NEGATIVE
LEUKOCYTES UA: NEGATIVE
Nitrite, UA: NEGATIVE
PH UA: 5 (ref 5.0–8.0)
PROTEIN UA: NEGATIVE
Urobilinogen, UA: 0.2 E.U./dL

## 2017-12-23 NOTE — Telephone Encounter (Signed)
The patient is having frequency, starting to have pain with urination, having chills, but no fever, and wanting to know if she can get a script for if she can do a drop off, but is also willing to have an appointment if necessary.  The patient is still nursing her baby.  She is also wondering if her being on an antibiotic will affect her baby b/c her baby is on Omnicef for an ear infection, please advise, thanks.

## 2017-12-23 NOTE — Telephone Encounter (Signed)
Urine for culture sent to lab. Mychart messages sent.

## 2017-12-24 ENCOUNTER — Other Ambulatory Visit: Payer: Self-pay

## 2017-12-24 MED ORDER — NITROFURANTOIN MONOHYD MACRO 100 MG PO CAPS: 100.0000 mg | ORAL_CAPSULE | Freq: Two times a day (BID) | ORAL | 0 refills | Status: DC

## 2018-01-10 LAB — URINE CULTURE

## 2018-02-16 ENCOUNTER — Encounter: Payer: Self-pay | Admitting: *Deleted

## 2018-02-18 ENCOUNTER — Ambulatory Visit (INDEPENDENT_AMBULATORY_CARE_PROVIDER_SITE_OTHER): Payer: BLUE CROSS/BLUE SHIELD | Admitting: Certified Nurse Midwife

## 2018-02-18 ENCOUNTER — Encounter: Payer: Self-pay | Admitting: Certified Nurse Midwife

## 2018-02-18 VITALS — BP 122/71 | HR 81 | Ht 62.0 in | Wt 137.4 lb

## 2018-02-18 DIAGNOSIS — Z01419 Encounter for gynecological examination (general) (routine) without abnormal findings: Secondary | ICD-10-CM | POA: Diagnosis not present

## 2018-02-18 NOTE — Progress Notes (Signed)
GYNECOLOGY ANNUAL PREVENTATIVE CARE ENCOUNTER NOTE  Subjective:   Jenny Mcguire is a 33 y.o. [phone] female here for a routine annual gynecologic exam.  Current complaints: none.   Denies abnormal vaginal bleeding, discharge, pelvic pain, problems with intercourse or other gynecologic concerns.    Gynecologic History No LMP recorded. Contraception: condoms Last Pap: 02/12/17. Results were: normal  Last mammogram: N/A.  Obstetric History OB History  Gravida Para Term Preterm AB Living  2 2 2     2   SAB TAB Ectopic Multiple Live Births        0 2    # Outcome Date GA Lbr Len/2nd Weight Sex Delivery Anes PTL Lv  2 Term 09/29/16 [redacted]w[redacted]d / 00:12 7 lb 0.2 oz (3.18 kg) F Vag-Spont EPI  LIV  1 Term 2011    F Vag-Spont  N LIV    Past Medical History:  Diagnosis Date  . Amenorrhea   . History of UTI     Past Surgical History:  Procedure Laterality Date  . BREAST ENHANCEMENT SURGERY      Current Outpatient Medications on File Prior to Visit  Medication Sig Dispense Refill  . nitrofurantoin, macrocrystal-monohydrate, (MACROBID) 100 MG capsule Take 1 capsule (100 mg total) by mouth 2 (two) times daily. 14 capsule 0  . Prenatal Vit-Fe Fumarate-FA (PRENATAL VITAMINS PLUS PO) Take by mouth.     No current facility-administered medications on file prior to visit.     Allergies  Allergen Reactions  . Sulfa Antibiotics     Social History:  reports that she has never smoked. She has never used smokeless tobacco. She reports current alcohol use. She reports that she does not use drugs.  Exercising 5 days a week trying to get off baby weight Clean eating.   Family History  Problem Relation Age of Onset  . Cancer Neg Hx   . Heart failure Neg Hx   . Diabetes Neg Hx     The following portions of the patient's history were reviewed and updated as appropriate: allergies, current medications, past family history, past medical history, past social history, past surgical history and  problem list.  Review of Systems Pertinent items noted in HPI and remainder of comprehensive ROS otherwise negative.   Objective:  There were no vitals taken for this visit. CONSTITUTIONAL: Well-developed, well-nourished female in no acute distress.  HENT:  Normocephalic, atraumatic, External right and left ear normal. Oropharynx is clear and moist EYES: Conjunctivae and EOM are normal. Pupils are equal, round, and reactive to light. No scleral icterus.  NECK: Normal range of motion, supple, no masses.  Normal thyroid.  SKIN: Skin is warm and dry. No rash noted. Not diaphoretic. No erythema. No pallor. MUSCULOSKELETAL: Normal range of motion. No tenderness.  No cyanosis, clubbing, or edema.  2+ distal pulses. NEUROLOGIC: Alert and oriented to person, place, and time. Normal reflexes, muscle tone coordination. No cranial nerve deficit noted. PSYCHIATRIC: Normal mood and affect. Normal behavior. Normal judgment and thought content. CARDIOVASCULAR: Normal heart rate noted, regular rhythm RESPIRATORY: Clear to auscultation bilaterally. Effort and breath sounds normal, no problems with respiration noted. BREASTS:lactating, trying to wean from breastfeeding.  Symmetric in size. No masses, skin changes, nipple drainage, or lymphadenopathy. ABDOMEN: Soft, normal bowel sounds, no distention noted.  No tenderness, rebound or guarding.  PELVIC: Normal appearing external genitalia; normal appearing vaginal mucosa and cervix.  No abnormal discharge noted.  Pap smear not indicated. Normal uterine size, no other palpable masses, no  uterine or adnexal tenderness.    Assessment and Plan:  Women's annual GYN exam Pap smear not indicated until 01/2020 Mammogram not indicated Labs: lipid profile today Routine preventative health maintenance measures emphasized. Please refer to After Visit Summary for other counseling recommendations.    Doreene Burke, CNM

## 2018-02-18 NOTE — Patient Instructions (Signed)
Preventive Care 18-39 Years, Female Preventive care refers to lifestyle choices and visits with your health care provider that can promote health and wellness. What does preventive care include?   A yearly physical exam. This is also called an annual well check.  Dental exams once or twice a year.  Routine eye exams. Ask your health care provider how often you should have your eyes checked.  Personal lifestyle choices, including: ? Daily care of your teeth and gums. ? Regular physical activity. ? Eating a healthy diet. ? Avoiding tobacco and drug use. ? Limiting alcohol use. ? Practicing safe sex. ? Taking vitamin and mineral supplements as recommended by your health care provider. What happens during an annual well check? The services and screenings done by your health care provider during your annual well check will depend on your age, overall health, lifestyle risk factors, and family history of disease. Counseling Your health care provider may ask you questions about your:  Alcohol use.  Tobacco use.  Drug use.  Emotional well-being.  Home and relationship well-being.  Sexual activity.  Eating habits.  Work and work environment.  Method of birth control.  Menstrual cycle.  Pregnancy history. Screening You may have the following tests or measurements:  Height, weight, and BMI.  Diabetes screening. This is done by checking your blood sugar (glucose) after you have not eaten for a while (fasting).  Blood pressure.  Lipid and cholesterol levels. These may be checked every 5 years starting at age 20.  Skin check.  Hepatitis C blood test.  Hepatitis B blood test.  Sexually transmitted disease (STD) testing.  BRCA-related cancer screening. This may be done if you have a family history of breast, ovarian, tubal, or peritoneal cancers.  Pelvic exam and Pap test. This may be done every 3 years starting at age 21. Starting at age 30, this may be done every 5  years if you have a Pap test in combination with an HPV test. Discuss your test results, treatment options, and if necessary, the need for more tests with your health care provider. Vaccines Your health care provider may recommend certain vaccines, such as:  Influenza vaccine. This is recommended every year.  Tetanus, diphtheria, and acellular pertussis (Tdap, Td) vaccine. You may need a Td booster every 10 years.  Varicella vaccine. You may need this if you have not been vaccinated.  HPV vaccine. If you are 26 or younger, you may need three doses over 6 months.  Measles, mumps, and rubella (MMR) vaccine. You may need at least one dose of MMR. You may also need a second dose.  Pneumococcal 13-valent conjugate (PCV13) vaccine. You may need this if you have certain conditions and were not previously vaccinated.  Pneumococcal polysaccharide (PPSV23) vaccine. You may need one or two doses if you smoke cigarettes or if you have certain conditions.  Meningococcal vaccine. One dose is recommended if you are age 19-21 years and a first-year college student living in a residence hall, or if you have one of several medical conditions. You may also need additional booster doses.  Hepatitis A vaccine. You may need this if you have certain conditions or if you travel or work in places where you may be exposed to hepatitis A.  Hepatitis B vaccine. You may need this if you have certain conditions or if you travel or work in places where you may be exposed to hepatitis B.  Haemophilus influenzae type b (Hib) vaccine. You may need this if you   have certain risk factors. Talk to your health care provider about which screenings and vaccines you need and how often you need them. This information is not intended to replace advice given to you by your health care provider. Make sure you discuss any questions you have with your health care provider. Document Released: 03/12/2001 Document Revised: 08/27/2016  Document Reviewed: 11/15/2014 Elsevier Interactive Patient Education  2019 [employer].

## 2018-02-19 LAB — LIPID PANEL
CHOLESTEROL TOTAL: 181 mg/dL (ref 100–199)
Chol/HDL Ratio: 2.5 ratio (ref 0.0–4.4)
HDL: 71 mg/dL (ref >39)
LDL CALC: 96 mg/dL (ref 0–99)
Triglycerides: 70 mg/dL (ref 0–149)
VLDL Cholesterol Cal: 14 mg/dL (ref 5–40)

## 2018-02-23 ENCOUNTER — Other Ambulatory Visit: Payer: Self-pay | Admitting: Certified Nurse Midwife

## 2018-02-23 DIAGNOSIS — E7800 Pure hypercholesterolemia, unspecified: Secondary | ICD-10-CM

## 2018-02-23 NOTE — Progress Notes (Signed)
Lipid profile rechecked after pt completed breastfeeding and fasting.   Doreene Burke, CNM

## 2018-09-09 ENCOUNTER — Telehealth: Payer: Self-pay

## 2018-09-09 ENCOUNTER — Other Ambulatory Visit: Payer: Self-pay

## 2018-09-09 MED ORDER — NORGESTIM-ETH ESTRAD TRIPHASIC 0.18/0.215/0.25 MG-25 MCG PO TABS: 1.0000 | ORAL_TABLET | Freq: Every day | ORAL | 11 refills | Status: DC

## 2018-09-09 NOTE — Telephone Encounter (Signed)
Script sent, mychart message sent.  

## 2018-12-31 ENCOUNTER — Ambulatory Visit: Payer: BLUE CROSS/BLUE SHIELD | Admitting: Adult Health

## 2019-01-01 ENCOUNTER — Telehealth: Payer: BLUE CROSS/BLUE SHIELD | Admitting: Adult Health

## 2019-01-04 ENCOUNTER — Other Ambulatory Visit: Payer: Self-pay | Admitting: Family Medicine

## 2019-01-04 DIAGNOSIS — R1084 Generalized abdominal pain: Secondary | ICD-10-CM

## 2019-01-04 DIAGNOSIS — R0989 Other specified symptoms and signs involving the circulatory and respiratory systems: Secondary | ICD-10-CM

## 2019-01-08 ENCOUNTER — Ambulatory Visit
Admission: RE | Admit: 2019-01-08 | Discharge: 2019-01-08 | Disposition: A | Discharge status: Home / Self Care | Payer: BC Managed Care – PPO | Source: Ambulatory Visit | Attending: Family Medicine | Admitting: Family Medicine

## 2019-01-08 ENCOUNTER — Other Ambulatory Visit: Payer: Self-pay

## 2019-01-08 DIAGNOSIS — R0989 Other specified symptoms and signs involving the circulatory and respiratory systems: Secondary | ICD-10-CM | POA: Insufficient documentation

## 2019-01-08 DIAGNOSIS — R1084 Generalized abdominal pain: Secondary | ICD-10-CM | POA: Diagnosis not present

## 2019-01-27 ENCOUNTER — Other Ambulatory Visit: Payer: BC Managed Care – PPO

## 2019-01-27 ENCOUNTER — Other Ambulatory Visit: Payer: Self-pay | Admitting: Certified Nurse Midwife

## 2019-01-27 ENCOUNTER — Other Ambulatory Visit: Payer: Self-pay

## 2019-01-27 DIAGNOSIS — N912 Amenorrhea, unspecified: Secondary | ICD-10-CM

## 2019-01-27 DIAGNOSIS — R350 Frequency of micturition: Secondary | ICD-10-CM

## 2019-01-27 NOTE — Progress Notes (Signed)
Pt had positive pregnancy test at home and has had some bleeding. HCG ordered. She will have repeated on Monday.   Philip Aspen, CNM

## 2019-01-27 NOTE — Progress Notes (Signed)
Pt here for blood work, state she is having some back pain and frequency of urination. Urine culture ordered.   Philip Aspen, CNM

## 2019-01-28 LAB — BETA HCG QUANT (REF LAB): hCG Quant: <1 m[IU]/mL

## 2019-01-29 LAB — URINE CULTURE

## 2019-02-22 ENCOUNTER — Other Ambulatory Visit: Payer: Self-pay

## 2019-02-22 ENCOUNTER — Encounter: Payer: Self-pay | Admitting: Certified Nurse Midwife

## 2019-02-22 ENCOUNTER — Ambulatory Visit (INDEPENDENT_AMBULATORY_CARE_PROVIDER_SITE_OTHER): Payer: BC Managed Care – PPO | Admitting: Certified Nurse Midwife

## 2019-02-22 VITALS — BP 124/80 | HR 112 | Ht 62.0 in | Wt 143.2 lb

## 2019-02-22 DIAGNOSIS — Z01419 Encounter for gynecological examination (general) (routine) without abnormal findings: Secondary | ICD-10-CM | POA: Diagnosis not present

## 2019-02-22 NOTE — Progress Notes (Signed)
GYNECOLOGY ANNUAL PREVENTATIVE CARE ENCOUNTER NOTE  History:     Jenny Mcguire is a 34 y.o. [id number] female here for a routine annual gynecologic exam.  Current complaints: none, period is late due on 23rd UPT-negative.   Denies abnormal vaginal bleeding, discharge, pelvic pain, problems with intercourse or other gynecologic concerns.     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 Obstetric History OB History  Gravida Para Term Preterm AB Living  2 2 2     2   SAB TAB Ectopic Multiple Live Births        0 2    # Outcome Date GA Lbr Len/2nd Weight Sex Delivery Anes PTL Lv  2 Term 09/29/16 [redacted]w[redacted]d / 00:12 7 lb 0.2 oz (3.18 kg) F Vag-Spont EPI  LIV  1 Term 2011    F Vag-Spont  N LIV    Past Medical History:  Diagnosis Date  . Amenorrhea   . History of UTI     Past Surgical History:  Procedure Laterality Date  . BREAST ENHANCEMENT SURGERY      Current Outpatient Medications on File Prior to Visit  Medication Sig Dispense Refill  . Prenatal Vit-Fe Fumarate-FA (PRENATAL VITAMINS PLUS PO) Take by mouth.     No current facility-administered medications on file prior to visit.    Allergies  Allergen Reactions  . Sulfa Antibiotics     Social History:  reports that she has never smoked. She has never used smokeless tobacco. She reports current alcohol use. She reports that she does not use drugs.  Family History  Problem Relation Age of Onset  . Cancer Neg Hx   . Heart failure Neg Hx   . Diabetes Neg Hx     The following portions of the patient's history were reviewed and updated as appropriate: allergies, current medications, past family history, past medical history, past social history, past surgical history and problem list.  Review of Systems Pertinent  items noted in HPI and remainder of comprehensive ROS otherwise negative.  Physical Exam:  BP 124/80   Pulse (!) 112   Ht 5\' 2"  (1.575 m)   Wt 143 lb 4 oz (65 kg)   LMP  (LMP Unknown)   BMI 26.20 kg/m  CONSTITUTIONAL: Well-developed, well-nourished female in no acute distress.  HENT:  Normocephalic, atraumatic, External right and left ear normal. Oropharynx is clear and moist EYES: Conjunctivae and EOM are normal. Pupils are equal, round, and reactive to light. No scleral icterus.  NECK: Normal range of motion, supple, no masses.  Normal thyroid.  SKIN: Skin is warm and dry. No rash noted. Not diaphoretic. No erythema. No pallor. MUSCULOSKELETAL: Normal range of motion. No tenderness.  No cyanosis, clubbing, or edema.  2+ distal pulses. NEUROLOGIC: Alert and oriented to person, place, and time. Normal reflexes, muscle tone coordination.  PSYCHIATRIC: Normal mood and affect. Normal behavior. Normal judgment and thought content. CARDIOVASCULAR: Normal heart rate noted, regular rhythm RESPIRATORY: Clear to auscultation bilaterally. Effort and breath sounds normal, no problems with respiration noted. BREASTS: Symmetric in size. No masses, tenderness, skin changes, nipple drainage, or lymphadenopathy bilaterally. ABDOMEN: Soft, no distention noted.  No tenderness, rebound or guarding.  PELVIC: Normal appearing external genitalia and urethral meatus; normal appearing vaginal mucosa and cervix.  No abnormal discharge noted. Small amount of blood seen  at cervical os, possibly starting her period.   Pap smear not indicated  Normal uterine size, no other palpable masses, no uterine or adnexal tenderness.   Assessment and Plan:  Annual Well Women GYN Exam Pap not due until 2022 Mammogram n/a  Labs: none due Refills: none  Routine preventative health maintenance measures emphasized. Please refer to After Visit Summary for other counseling recommendations.      Doreene Burke, CNM

## 2019-02-22 NOTE — Patient Instructions (Signed)
Preventive Care 21-34 Years Old, Female Preventive care refers to visits with your health care provider and lifestyle choices that can promote health and wellness. This includes:  A yearly physical exam. This may also be called an annual well check.  Regular dental visits and eye exams.  Immunizations.  Screening for certain conditions.  Healthy lifestyle choices, such as eating a healthy diet, getting regular exercise, not using drugs or products that contain nicotine and tobacco, and limiting alcohol use. What can I expect for my preventive care visit? Physical exam Your health care provider will check your:  Height and weight. This may be used to calculate body mass index (BMI), which tells if you are at a healthy weight.  Heart rate and blood pressure.  Skin for abnormal spots. Counseling Your health care provider may ask you questions about your:  Alcohol, tobacco, and drug use.  Emotional well-being.  Home and relationship well-being.  Sexual activity.  Eating habits.  Work and work environment.  Method of birth control.  Menstrual cycle.  Pregnancy history. What immunizations do I need?  Influenza (flu) vaccine  This is recommended every year. Tetanus, diphtheria, and pertussis (Tdap) vaccine  You may need a Td booster every 10 years. Varicella (chickenpox) vaccine  You may need this if you have not been vaccinated. Human papillomavirus (HPV) vaccine  If recommended by your health care provider, you may need three doses over 6 months. Measles, mumps, and rubella (MMR) vaccine  You may need at least one dose of MMR. You may also need a second dose. Meningococcal conjugate (MenACWY) vaccine  One dose is recommended if you are age 19-21 years and a first-year college student living in a residence hall, or if you have one of several medical conditions. You may also need additional booster doses. Pneumococcal conjugate (PCV13) vaccine  You may need  this if you have certain conditions and were not previously vaccinated. Pneumococcal polysaccharide (PPSV23) vaccine  You may need one or two doses if you smoke cigarettes or if you have certain conditions. Hepatitis A vaccine  You may need this if you have certain conditions or if you travel or work in places where you may be exposed to hepatitis A. Hepatitis B vaccine  You may need this if you have certain conditions or if you travel or work in places where you may be exposed to hepatitis B. Haemophilus influenzae type b (Hib) vaccine  You may need this if you have certain conditions. You may receive vaccines as individual doses or as more than one vaccine together in one shot (combination vaccines). Talk with your health care provider about the risks and benefits of combination vaccines. What tests do I need?  Blood tests  Lipid and cholesterol levels. These may be checked every 5 years starting at age 20.  Hepatitis C test.  Hepatitis B test. Screening  Diabetes screening. This is done by checking your blood sugar (glucose) after you have not eaten for a while (fasting).  Sexually transmitted disease (STD) testing.  BRCA-related cancer screening. This may be done if you have a family history of breast, ovarian, tubal, or peritoneal cancers.  Pelvic exam and Pap test. This may be done every 3 years starting at age 21. Starting at age 30, this may be done every 5 years if you have a Pap test in combination with an HPV test. Talk with your health care provider about your test results, treatment options, and if necessary, the need for more tests.   Follow these instructions at home: Eating and drinking   Eat a diet that includes fresh fruits and vegetables, whole grains, lean protein, and low-fat dairy.  Take vitamin and mineral supplements as recommended by your health care provider.  Do not drink alcohol if: ? Your health care provider tells you not to drink. ? You are  pregnant, may be pregnant, or are planning to become pregnant.  If you drink alcohol: ? Limit how much you have to 0-1 drink a day. ? Be aware of how much alcohol is in your drink. In the [place] one drink equals one 12 oz bottle of beer (355 mL), one 5 oz glass of wine (148 mL), or one 1 oz glass of hard liquor (44 mL). Lifestyle  Take daily care of your teeth and gums.  Stay active. Exercise for at least 30 minutes on 5 or more days each week.  Do not use any products that contain nicotine or tobacco, such as cigarettes, e-cigarettes, and chewing tobacco. If you need help quitting, ask your health care provider.  If you are sexually active, practice safe sex. Use a condom or other form of birth control (contraception) in order to prevent pregnancy and STIs (sexually transmitted infections). If you plan to become pregnant, see your health care provider for a preconception visit. What's next?  Visit your health care provider once a year for a well check visit.  Ask your health care provider how often you should have your eyes and teeth checked.  Stay up to date on all vaccines. This information is not intended to replace advice given to you by your health care provider. Make sure you discuss any questions you have with your health care provider. Document Revised: 09/25/2017 Document Reviewed: 09/25/2017 Elsevier Patient Education  2020 [employer].

## 2019-03-01 ENCOUNTER — Other Ambulatory Visit: Payer: Self-pay | Admitting: Certified Nurse Midwife

## 2019-03-01 DIAGNOSIS — N939 Abnormal uterine and vaginal bleeding, unspecified: Secondary | ICD-10-CM

## 2019-03-01 NOTE — Progress Notes (Signed)
Pt having irregular periods. They are late and heavy bleeding for 1-2 days. Orders placed for u/s.   Doreene Burke, CNM

## 2019-03-02 ENCOUNTER — Other Ambulatory Visit: Payer: Self-pay

## 2019-03-02 ENCOUNTER — Ambulatory Visit (INDEPENDENT_AMBULATORY_CARE_PROVIDER_SITE_OTHER): Payer: BC Managed Care – PPO

## 2019-03-02 DIAGNOSIS — N938 Other specified abnormal uterine and vaginal bleeding: Secondary | ICD-10-CM

## 2019-03-02 DIAGNOSIS — N939 Abnormal uterine and vaginal bleeding, unspecified: Secondary | ICD-10-CM

## 2019-05-09 ENCOUNTER — Emergency Department
Admission: EM | Admit: 2019-05-09 | Discharge: 2019-05-09 | Disposition: A | Discharge status: Other Acute Inpatient Hospital | Payer: Self-pay

## 2019-05-17 ENCOUNTER — Emergency Department
Admission: EM | Admit: 2019-05-17 | Discharge: 2019-05-17 | Disposition: A | Discharge status: Home / Self Care | Payer: BC Managed Care – PPO | Attending: Emergency Medicine | Admitting: Emergency Medicine

## 2019-05-17 ENCOUNTER — Other Ambulatory Visit: Payer: Self-pay

## 2019-05-17 ENCOUNTER — Encounter: Payer: Self-pay | Admitting: *Deleted

## 2019-05-17 DIAGNOSIS — R519 Headache, unspecified: Secondary | ICD-10-CM | POA: Insufficient documentation

## 2019-05-17 DIAGNOSIS — R5383 Other fatigue: Secondary | ICD-10-CM | POA: Diagnosis not present

## 2019-05-17 DIAGNOSIS — R202 Paresthesia of skin: Secondary | ICD-10-CM | POA: Diagnosis not present

## 2019-05-17 DIAGNOSIS — R42 Dizziness and giddiness: Secondary | ICD-10-CM | POA: Insufficient documentation

## 2019-05-17 DIAGNOSIS — R450 Nervousness: Secondary | ICD-10-CM | POA: Diagnosis not present

## 2019-05-17 DIAGNOSIS — R0981 Nasal congestion: Secondary | ICD-10-CM | POA: Diagnosis not present

## 2019-05-17 DIAGNOSIS — Z79899 Other long term (current) drug therapy: Secondary | ICD-10-CM | POA: Diagnosis not present

## 2019-05-17 LAB — CBC
HCT: 42.6 % (ref 36.0–46.0)
Hemoglobin: 14.9 g/dL (ref 12.0–15.0)
MCH: 32.7 pg (ref 26.0–34.0)
MCHC: 35 g/dL (ref 30.0–36.0)
MCV: 93.6 fL (ref 80.0–100.0)
Platelets: 259 K/uL (ref 150–400)
RBC: 4.55 MIL/uL (ref 3.87–5.11)
RDW: 12.1 % (ref 11.5–15.5)
WBC: 10.7 K/uL — ABNORMAL HIGH (ref 4.0–10.5)
nRBC: 0 % (ref 0.0–0.2)

## 2019-05-17 LAB — URINALYSIS, COMPLETE (UACMP) WITH MICROSCOPIC
Bacteria, UA: NONE SEEN
Bilirubin Urine: NEGATIVE
Glucose, UA: NEGATIVE mg/dL
Hgb urine dipstick: NEGATIVE
Ketones, ur: 5 mg/dL — AB
Leukocytes,Ua: NEGATIVE
Nitrite: NEGATIVE
Protein, ur: NEGATIVE mg/dL
Specific Gravity, Urine: 1.005 (ref 1.005–1.030)
pH: 6 (ref 5.0–8.0)

## 2019-05-17 LAB — BASIC METABOLIC PANEL
Anion gap: 7 (ref 5–15)
BUN: 12 mg/dL (ref 6–20)
CO2: 27 mmol/L (ref 22–32)
Calcium: 9.7 mg/dL (ref 8.9–10.3)
Chloride: 104 mmol/L (ref 98–111)
Creatinine, Ser: 0.66 mg/dL (ref 0.44–1.00)
GFR calc Af Amer: >60 mL/min (ref >60)
GFR calc non Af Amer: >60 mL/min (ref >60)
Glucose, Bld: 97 mg/dL (ref 70–99)
Potassium: 3.6 mmol/L (ref 3.5–5.1)
Sodium: 138 mmol/L (ref 135–145)

## 2019-05-17 LAB — POCT PREGNANCY, URINE: Preg Test, Ur: NEGATIVE

## 2019-05-17 MED ORDER — MECLIZINE HCL 25 MG PO TABS: 25.0000 mg | ORAL_TABLET | Freq: Three times a day (TID) | ORAL | 0 refills | Status: DC | PRN

## 2019-05-17 NOTE — ED Triage Notes (Signed)
Pt recently had tingling in scalp and finished prednisone last week.  Today, pt has fatigue and feels weak, jittery.  Pt alert  Speech clear.  No pain.

## 2019-05-17 NOTE — Discharge Instructions (Signed)
Please seek medical attention for any high fevers, chest pain, shortness of breath, change in behavior, persistent vomiting, bloody stool or any other new or concerning symptoms.  

## 2019-05-17 NOTE — ED Notes (Signed)
Pt. POC resulted Neg. 

## 2019-05-17 NOTE — ED Provider Notes (Signed)
[facility] Emergency Department Provider Note   ____________________________________________   I have reviewed the triage vital signs and the nursing notes.   HISTORY  Chief Complaint Fatigue   History limited by: Not Limited   HPI Jenny Mcguire is a 34 y.o. female who presents to the emergency department today with multiple concerns.  Patient states that roughly 2 weeks ago the patient started having issues with sinus congestion and a feeling of tingling on her scalp.  In addition she started feeling dizzy and thought she might have vertigo.  She saw her primary care provider who prescribed Augmentin as well as steroids.  She is now finished the steroids and is on day 8 of the Augmentin.  She states she was on a beach vacation when the symptoms started although it was very stressful beach vacation.  The patient came in today because she had subacute onset of fatigue as well as feeling jittery.  She is not sure if she is taking steroids in the past and if she had already been number of years ago.   Records reviewed. Per medical record review patient has a history of recent visit to PCP, prescribed augmentin and prednisone 20 mg a day.  Past Medical History:  Diagnosis Date  . Amenorrhea   . History of UTI     There are no problems to display for this patient.   Past Surgical History:  Procedure Laterality Date  . BREAST ENHANCEMENT SURGERY      Prior to Admission medications   Medication Sig Start Date End Date Taking? Authorizing Provider  Multiple Vitamin (MULTI-VITAMIN) tablet Take by mouth.    [provider]    Allergies Sulfa antibiotics  Family History  Problem Relation Age of Onset  . Cancer Neg Hx   . Heart failure Neg Hx   . Diabetes Neg Hx     Social History Social History   Tobacco Use  . Smoking status: Never Smoker  . Smokeless tobacco: Never Used  Substance Use Topics  . Alcohol use: Not Currently   Comment: occas/ not during pregnancy  . Drug use: No    Review of Systems Constitutional: Positive for fatigue. Eyes: No visual changes. ENT: No sore throat. Cardiovascular: Denies chest pain. Respiratory: Denies shortness of breath. Gastrointestinal: No abdominal pain.  No nausea, no vomiting.  No diarrhea.   Genitourinary: Negative for dysuria. Musculoskeletal: Negative for back pain. Skin: Positive for tingling to the scalp. Neurological: Positive for headache. Positive for dizziness. ____________________________________________   PHYSICAL EXAM:  VITAL SIGNS: ED Triage Vitals  Enc Vitals Group     BP 05/17/19 2023 126/86     Pulse Rate 05/17/19 2023 95     Resp 05/17/19 2023 18     Temp 05/17/19 2023 98.7 F (37.1 C)     Temp Source 05/17/19 2023 Oral     SpO2 05/17/19 2023 97 %     Weight 05/17/19 2024 140 lb (63.5 kg)     Height 05/17/19 2024 5\' 2"  (1.575 m)     Head Circumference --      Peak Flow --      Pain Score 05/17/19 2024 0   Constitutional: Alert and oriented.  Eyes: Conjunctivae are normal.  ENT      Head: Normocephalic and atraumatic.      Nose: No congestion/rhinnorhea.      Mouth/Throat: Mucous membranes are moist.      Neck: No stridor. Hematological/Lymphatic/Immunilogical: No cervical lymphadenopathy. Cardiovascular:  Normal rate, regular rhythm.  No murmurs, rubs, or gallops.  Respiratory: Normal respiratory effort without tachypnea nor retractions. Breath sounds are clear and equal bilaterally. No wheezes/rales/rhonchi. Gastrointestinal: Soft and non tender. No rebound. No guarding.  Genitourinary: Deferred Musculoskeletal: Normal range of motion in all extremities. No lower extremity edema. Neurologic:  Normal speech and language. PERRL. EOMI. Strength 5/5 in upper and lower extremities. Sensation intact. No gross focal neurologic deficits are appreciated.  Skin:  Skin is warm, dry and intact. No rash noted. Psychiatric: Mood and affect are  normal. Speech and behavior are normal. Patient exhibits appropriate insight and judgment.  ____________________________________________    LABS (pertinent positives/negatives)  BMP wnl Upreg negative CBC wbc 10.7, hgb 14.9, plt 259 UA clear, ketones 5, rbc and wbc 0-5  ____________________________________________   EKG  None  ____________________________________________    RADIOLOGY  None  ____________________________________________   PROCEDURES  Procedures  ____________________________________________   INITIAL IMPRESSION / ASSESSMENT AND PLAN / ED COURSE  Pertinent labs & imaging results that were available during my care of the patient were reviewed by me and considered in my medical decision making (see chart for details).   Patient presents to the emergency department today with some concerns for fatigue, jittery feeling.  Patient also continues to have a feeling of tingling to her scalp and dizziness that she has had for almost 2 weeks now.  On exam patient without any neurofocal deficits.  No tenderness palpation of the cervical spine.  Blood work without concerning electrolyte abnormalities.  At this point I doubt CVA.  Do wonder if she is suffering from some vertigo.  Will give patient prescription for meclizine.  Additionally I do wonder if some of her symptoms today are due to steroid use.  Discussed with patient return precautions importance of follow-up with primary care.  ____________________________________________   FINAL CLINICAL IMPRESSION(S) / ED DIAGNOSES  Final diagnoses:  Fatigue, unspecified type  Jittery feeling  Vertigo  Paresthesia of skin     Note: This dictation was prepared with Dragon dictation. Any transcriptional errors that result from this process are unintentional     Nance Pear, MD 05/17/19 2337

## 2019-09-21 ENCOUNTER — Other Ambulatory Visit (HOSPITAL_COMMUNITY): Payer: Self-pay | Admitting: Internal Medicine

## 2019-09-21 ENCOUNTER — Other Ambulatory Visit: Payer: Self-pay

## 2019-09-21 ENCOUNTER — Other Ambulatory Visit: Payer: Self-pay | Admitting: Internal Medicine

## 2019-09-21 ENCOUNTER — Ambulatory Visit
Admission: RE | Admit: 2019-09-21 | Discharge: 2019-09-21 | Disposition: A | Discharge status: Home / Self Care | Payer: BC Managed Care – PPO | Source: Ambulatory Visit | Attending: Internal Medicine | Admitting: Internal Medicine

## 2019-09-21 DIAGNOSIS — R1011 Right upper quadrant pain: Secondary | ICD-10-CM

## 2019-10-30 ENCOUNTER — Other Ambulatory Visit: Payer: Self-pay | Admitting: Certified Nurse Midwife

## 2019-10-30 DIAGNOSIS — K649 Unspecified hemorrhoids: Secondary | ICD-10-CM

## 2019-11-04 ENCOUNTER — Ambulatory Visit (INDEPENDENT_AMBULATORY_CARE_PROVIDER_SITE_OTHER): Payer: BC Managed Care – PPO | Admitting: Surgery

## 2019-11-04 ENCOUNTER — Encounter: Payer: Self-pay | Admitting: Surgery

## 2019-11-04 ENCOUNTER — Other Ambulatory Visit: Payer: Self-pay

## 2019-11-04 VITALS — BP 117/79 | HR 108 | Temp 98.3°F | Resp 12 | Ht 62.0 in | Wt 136.2 lb

## 2019-11-04 DIAGNOSIS — K640 First degree hemorrhoids: Secondary | ICD-10-CM | POA: Diagnosis not present

## 2019-11-04 NOTE — Progress Notes (Signed)
Patient ID: Jenny Mcguire, female   DOB: [birth year], 34 y.o.   MRN: [id number]  Chief Complaint: Hemorrhoids  History of Present Illness Jenny Mcguire is a 34 y.o. female with history of hemorrhoids that began after her first pregnancy approximately 10 years ago.  She reports that they flareup once in a while.  It means that she has pressure at times with associated itching.  She has seen minimal blood with wiping.  She denies constipation currently, but notes she has had a history.  She has no known history of thrombosis.  Most recent flareup began about 3 weeks ago.  Without evaluation from her primary care provider, she began utilizing hydrocortisone suppositories.  She is also changed her eating habits.  She has no family history of colon cancer.  She reports her current bowel activity at two times daily.  She denies any history of prolapse.  And the only supplement she takes is a probiotic, she never takes laxatives nor has she utilized additional fiber.  Past Medical History Past Medical History:  Diagnosis Date  . Amenorrhea   . History of UTI       Past Surgical History:  Procedure Laterality Date  . BREAST ENHANCEMENT SURGERY      Allergies  Allergen Reactions  . Sulfa Antibiotics     Current Outpatient Medications  Medication Sig Dispense Refill  . Multiple Vitamin (MULTI-VITAMIN) tablet Take by mouth.     No current facility-administered medications for this visit.    Family History Family History  Problem Relation Age of Onset  . COPD Mother   . Cancer Neg Hx   . Heart failure Neg Hx   . Diabetes Neg Hx       Social History Social History   Tobacco Use  . Smoking status: Never Smoker  . Smokeless tobacco: Never Used  Vaping Use  . Vaping Use: Never used  Substance Use Topics  . Alcohol use: Not Currently    Comment: occas/ not during pregnancy  . Drug use: No        Review of Systems  Constitutional: Negative.   HENT: Negative.   Eyes:  Negative.   Respiratory: Negative.   Cardiovascular: Negative.   Gastrointestinal: Negative for blood in stool, constipation, diarrhea and melena.  Genitourinary: Negative for dysuria, frequency, hematuria and urgency.  Skin: Negative.   Neurological: Negative.   Psychiatric/Behavioral: Negative.       Physical Exam Blood pressure 117/79, pulse (!) 108, temperature 98.3 F (36.8 C), resp. rate 12, height 5\' 2"  (1.575 m), weight 136 lb 3.2 oz (61.8 kg), currently breastfeeding. Last Weight  Most recent update: 11/04/2019 11:33 AM   Weight  61.8 kg (136 lb 3.2 oz)            CONSTITUTIONAL: Well developed, and nourished, appropriately responsive and aware without distress.  Young healthy fit female in no pain/apparent discomfort EYES: Sclera non-icteric.   EARS, NOSE, MOUTH AND THROAT: Mask worn.  Hearing is intact to voice.  NECK: Trachea is midline, and there is no jugular venous distension.  LYMPH NODES:  Lymph nodes in the neck are not enlarged. RESPIRATORY:  Lungs are clear, and breath sounds are equal bilaterally. Normal respiratory effort without pathologic use of accessory muscles. CARDIOVASCULAR: Heart is regular in rate and rhythm. GI: The abdomen is soft, nontender, and nondistended. There were no palpable masses. I did not appreciate hepatosplenomegaly. There were normal bowel sounds. GU: DRE: Perianal skin is normal, there are no  tags, irregularities or induration or dermal changes.  Anal canal is slightly full with grade 1 hemorrhoids circumferentially.  There are no prominent piles, no redundancy.  There is a degree of rectocele anteriorly.  There is no bright red blood.  There was no remarkable tenderness or intolerance of exam.  Chaperone Patrina Levering. MUSCULOSKELETAL:  Symmetrical muscle tone appreciated in all four extremities.    SKIN: Skin turgor is normal. No pathologic skin lesions appreciated.  NEUROLOGIC:  Motor and sensation appear grossly normal.  Cranial  nerves are grossly without defect. PSYCH:  Alert and oriented to person, place and time. Affect is appropriate for situation.  Data Reviewed I have personally reviewed what is currently available of the patient's imaging, recent labs and medical records.   Labs:  CBC Latest Ref Rng & Units 05/17/2019 09/30/2016 09/29/2016  WBC 4.0 - 10.5 K/uL 10.7(H) 10.2 8.4  Hemoglobin 12.0 - 15.0 g/dL 03.5 46.5 68.1  Hematocrit 36 - 46 % 42.6 36.1 37.8  Platelets 150 - 400 K/uL 259 148(L) 157   CMP Latest Ref Rng & Units 05/17/2019  Glucose 70 - 99 mg/dL 97  BUN 6 - 20 mg/dL 12  Creatinine 2.75 - 1.70 mg/dL 0.17  Sodium 494 - 496 mmol/L 138  Potassium 3.5 - 5.1 mmol/L 3.6  Chloride 98 - 111 mmol/L 104  CO2 22 - 32 mmol/L 27  Calcium 8.9 - 10.3 mg/dL 9.7      Imaging: Radiology review:   Within last 24 hrs: No results found.  Assessment    Grade 1 internal hemorrhoids. There are no problems to display for this patient.   Plan    No surgical intervention to be entertained. Advised to pursue a goal of 25 to 30 g of fiber daily.  Made aware that the majority of this may be through natural sources, but advised to be aware of actual consumption and to ensure minimal consumption by daily supplementation.  Various forms of supplements discussed.  Strongly advised to consume more fluids to ensure adequate hydration, instructed to watch color of urine to determine adequacy of hydration.  Clarity is pursued in urine output, and bowel activity that correlates to significant meal intake.  Patient is to avoid deferring having bowel movements, advised to take the time at the first sign of sensation, typically following meals and in the morning.  Subsequent utilization of MiraLAX to ensure at least daily movement, ideally twice daily bowel movements.  If multiple doses of MiraLAX are necessary utilize them.  Face-to-face time spent with the patient and accompanying care providers(if present) was 30 minutes,  with more than 50% of the time spent counseling, educating, and coordinating care of the patient.      Campbell Lerner M.D., FACS 11/04/2019, 12:16 PM

## 2019-11-04 NOTE — Patient Instructions (Addendum)
Start taking a fiber supplement such as a fiber gummie, benefiber metamucil, etc. Continue to drink plenty of fluids.   Call the office if you have any questions or concerns.   High-Fiber Diet Fiber, also called dietary fiber, is a type of carbohydrate that is found in fruits, vegetables, whole grains, and beans. A high-fiber diet can have many health benefits. Your health care provider may recommend a high-fiber diet to help:  Prevent constipation. Fiber can make your bowel movements more regular.  Lower your cholesterol.  Relieve the following conditions: ? Swelling of veins in the anus (hemorrhoids). ? Swelling and irritation (inflammation) of specific areas of the digestive tract (uncomplicated diverticulosis). ? A problem of the large intestine (colon) that sometimes causes pain and diarrhea (irritable bowel syndrome, IBS).  Prevent overeating as part of a weight-loss plan.  Prevent heart disease, type 2 diabetes, and certain cancers. What is my plan? The recommended daily fiber intake in grams (g) includes:  38 g for men age 47 or younger.  30 g for men over age 75.  25 g for women age 58 or younger.  21 g for women over age 60. You can get the recommended daily intake of dietary fiber by:  Eating a variety of fruits, vegetables, grains, and beans.  Taking a fiber supplement, if it is not possible to get enough fiber through your diet. What do I need to know about a high-fiber diet?  It is better to get fiber through food sources rather than from fiber supplements. There is not a lot of research about how effective supplements are.  Always check the fiber content on the nutrition facts label of any prepackaged food. Look for foods that contain 5 g of fiber or more per serving.  Talk with a diet and nutrition specialist (dietitian) if you have questions about specific foods that are recommended or not recommended for your medical condition, especially if those foods are  not listed below.  Gradually increase how much fiber you consume. If you increase your intake of dietary fiber too quickly, you may have bloating, cramping, or gas.  Drink plenty of water. Water helps you to digest fiber. What are tips for following this plan?  Eat a wide variety of high-fiber foods.  Make sure that half of the grains that you eat each day are whole grains.  Eat breads and cereals that are made with whole-grain flour instead of refined flour or white flour.  Eat brown rice, bulgur wheat, or millet instead of white rice.  Start the day with a breakfast that is high in fiber, such as a cereal that contains 5 g of fiber or more per serving.  Use beans in place of meat in soups, salads, and pasta dishes.  Eat high-fiber snacks, such as berries, raw vegetables, nuts, and popcorn.  Choose whole fruits and vegetables instead of processed forms like juice or sauce. What foods can I eat?  Fruits Berries. Pears. Apples. Oranges. Avocado. Prunes and raisins. Dried figs. Vegetables Sweet potatoes. Spinach. Kale. Artichokes. Cabbage. Broccoli. Cauliflower. Green peas. Carrots. Squash. Grains Whole-grain breads. Multigrain cereal. Oats and oatmeal. Brown rice. Barley. Bulgur wheat. Millet. Quinoa. Bran muffins. Popcorn. Rye wafer crackers. Meats and other proteins Navy, kidney, and pinto beans. Soybeans. Split peas. Lentils. Nuts and seeds. Dairy Fiber-fortified yogurt. Beverages Fiber-fortified soy milk. Fiber-fortified orange juice. Other foods Fiber bars. The items listed above may not be a complete list of recommended foods and beverages. Contact a dietitian for  more options. What foods are not recommended? Fruits Fruit juice. Cooked, strained fruit. Vegetables Fried potatoes. Canned vegetables. Well-cooked vegetables. Grains White bread. Pasta made with refined flour. White rice. Meats and other proteins Fatty cuts of meat. Fried chicken or fried  fish. Dairy Milk. Yogurt. Cream cheese. Sour cream. Fats and oils Butters. Beverages Soft drinks. Other foods Cakes and pastries. The items listed above may not be a complete list of foods and beverages to avoid. Contact a dietitian for more information. Summary  Fiber is a type of carbohydrate. It is found in fruits, vegetables, whole grains, and beans.  There are many health benefits of eating a high-fiber diet, such as preventing constipation, lowering blood cholesterol, helping with weight loss, and reducing your risk of heart disease, diabetes, and certain cancers.  Gradually increase your intake of fiber. Increasing too fast can result in cramping, bloating, and gas. Drink plenty of water while you increase your fiber.  The best sources of fiber include whole fruits and vegetables, whole grains, nuts, seeds, and beans. This information is not intended to replace advice given to you by your health care provider. Make sure you discuss any questions you have with your health care provider. Document Revised: 11/18/2016 Document Reviewed: 11/18/2016 Elsevier Patient Education  2020 [employer].

## 2019-11-08 DIAGNOSIS — M9901 Segmental and somatic dysfunction of cervical region: Secondary | ICD-10-CM | POA: Diagnosis not present

## 2019-11-08 DIAGNOSIS — G44209 Tension-type headache, unspecified, not intractable: Secondary | ICD-10-CM | POA: Diagnosis not present

## 2019-11-08 DIAGNOSIS — M9902 Segmental and somatic dysfunction of thoracic region: Secondary | ICD-10-CM | POA: Diagnosis not present

## 2019-11-08 DIAGNOSIS — M9903 Segmental and somatic dysfunction of lumbar region: Secondary | ICD-10-CM | POA: Diagnosis not present

## 2019-12-06 DIAGNOSIS — M9903 Segmental and somatic dysfunction of lumbar region: Secondary | ICD-10-CM | POA: Diagnosis not present

## 2019-12-06 DIAGNOSIS — G44209 Tension-type headache, unspecified, not intractable: Secondary | ICD-10-CM | POA: Diagnosis not present

## 2019-12-06 DIAGNOSIS — M9902 Segmental and somatic dysfunction of thoracic region: Secondary | ICD-10-CM | POA: Diagnosis not present

## 2019-12-06 DIAGNOSIS — M9901 Segmental and somatic dysfunction of cervical region: Secondary | ICD-10-CM | POA: Diagnosis not present

## 2020-01-03 DIAGNOSIS — G44209 Tension-type headache, unspecified, not intractable: Secondary | ICD-10-CM | POA: Diagnosis not present

## 2020-01-03 DIAGNOSIS — M9903 Segmental and somatic dysfunction of lumbar region: Secondary | ICD-10-CM | POA: Diagnosis not present

## 2020-01-03 DIAGNOSIS — M9901 Segmental and somatic dysfunction of cervical region: Secondary | ICD-10-CM | POA: Diagnosis not present

## 2020-01-03 DIAGNOSIS — M9902 Segmental and somatic dysfunction of thoracic region: Secondary | ICD-10-CM | POA: Diagnosis not present

## 2020-01-12 ENCOUNTER — Other Ambulatory Visit: Payer: Self-pay

## 2020-01-12 ENCOUNTER — Ambulatory Visit (INDEPENDENT_AMBULATORY_CARE_PROVIDER_SITE_OTHER): Payer: BC Managed Care – PPO | Admitting: Certified Nurse Midwife

## 2020-01-12 DIAGNOSIS — Z3201 Encounter for pregnancy test, result positive: Secondary | ICD-10-CM

## 2020-01-12 DIAGNOSIS — R300 Dysuria: Secondary | ICD-10-CM

## 2020-01-12 LAB — POCT URINALYSIS DIPSTICK
Bilirubin, UA: NEGATIVE
Blood, UA: NEGATIVE
Glucose, UA: NEGATIVE
Ketones, UA: NEGATIVE
Leukocytes, UA: NEGATIVE
Nitrite, UA: NEGATIVE
Protein, UA: NEGATIVE
Spec Grav, UA: <=1.005 — AB (ref 1.010–1.025)
Urobilinogen, UA: 0.2 E.U./dL
pH, UA: 5 (ref 5.0–8.0)

## 2020-01-12 LAB — POCT URINE PREGNANCY: Preg Test, Ur: NEGATIVE

## 2020-01-12 NOTE — Progress Notes (Signed)
Pt present for urine drop off. Pt had an appointment at 11 with another office and left the building. Pt was called and asked for symptoms.  Pt c/o lower back pain for the past few days, pain down her legs. Denies burning, urgency. Pt stated (+) HPT. She stated the last time she was pregnant she had a UTI.   Informed urine c&s results will take 2-3 days. Pt verbalized understanding.

## 2020-01-14 LAB — URINE CULTURE: Organism ID, Bacteria: NO GROWTH

## 2020-01-17 ENCOUNTER — Other Ambulatory Visit: Payer: BC Managed Care – PPO

## 2020-01-17 ENCOUNTER — Other Ambulatory Visit: Payer: Self-pay

## 2020-01-17 DIAGNOSIS — N912 Amenorrhea, unspecified: Secondary | ICD-10-CM | POA: Diagnosis not present

## 2020-01-18 ENCOUNTER — Encounter: Payer: BC Managed Care – PPO | Admitting: Certified Nurse Midwife

## 2020-01-18 LAB — BETA HCG QUANT (REF LAB): hCG Quant: 4 m[IU]/mL

## 2020-01-20 ENCOUNTER — Encounter: Payer: Self-pay | Admitting: Certified Nurse Midwife

## 2020-01-20 ENCOUNTER — Ambulatory Visit (INDEPENDENT_AMBULATORY_CARE_PROVIDER_SITE_OTHER): Payer: BC Managed Care – PPO | Admitting: Certified Nurse Midwife

## 2020-01-20 ENCOUNTER — Other Ambulatory Visit: Payer: Self-pay

## 2020-01-20 VITALS — BP 110/76 | HR 93 | Ht 62.0 in | Wt 144.6 lb

## 2020-01-20 DIAGNOSIS — Z3201 Encounter for pregnancy test, result positive: Secondary | ICD-10-CM | POA: Diagnosis not present

## 2020-01-20 DIAGNOSIS — O209 Hemorrhage in early pregnancy, unspecified: Secondary | ICD-10-CM

## 2020-01-20 DIAGNOSIS — O200 Threatened abortion: Secondary | ICD-10-CM | POA: Diagnosis not present

## 2020-01-20 NOTE — Patient Instructions (Addendum)
Miscarriage A miscarriage is the loss of an unborn baby (fetus) before the 20th week of pregnancy. Most miscarriages happen during the first 3 months of pregnancy. Sometimes, a miscarriage can happen before a woman knows that she is pregnant. Having a miscarriage can be an emotional experience. If you have had a miscarriage, talk with your health care provider about any questions you may have about miscarrying, the grieving process, and your plans for future pregnancy. What are the causes? A miscarriage may be caused by:  Problems with the genes or chromosomes of the fetus. These problems make it impossible for the baby to develop normally. They are often the result of random errors that occur early in the development of the baby, and are not passed from parent to child (not inherited).  Infection of the cervix or uterus.  Conditions that affect hormone balance in the body.  Problems with the cervix, such as the cervix opening and thinning before pregnancy is at term (cervical insufficiency).  Problems with the uterus. These may include: ? A uterus with an abnormal shape. ? Fibroids in the uterus. ? Congenital abnormalities. These are problems that were present at birth.  Certain medical conditions.  Smoking, drinking alcohol, or using drugs.  Injury (trauma). In many cases, the cause of a miscarriage is not known. What are the signs or symptoms? Symptoms of this condition include:  Vaginal bleeding or spotting, with or without cramps or pain.  Pain or cramping in the abdomen or lower back.  Passing fluid, tissue, or blood clots from the vagina. How is this diagnosed? This condition may be diagnosed based on:  A physical exam.  Ultrasound.  Blood tests.  Urine tests. How is this treated? Treatment for a miscarriage is sometimes not necessary if you naturally pass all the tissue that was in your uterus. If necessary, this condition may be treated with:  Dilation and  curettage (D&C). This is a procedure in which the cervix is stretched open and the lining of the uterus (endometrium) is scraped. This is done only if tissue from the fetus or placenta remains in the body (incomplete miscarriage).  Medicines, such as: ? Antibiotic medicine, to treat infection. ? Medicine to help the body pass any remaining tissue. ? Medicine to reduce (contract) the size of the uterus. These medicines may be given if you have a lot of bleeding. If you have Rh negative blood and your baby was Rh positive, you will need a shot of a medicine called Rh immunoglobulinto protect your future babies from Rh blood problems. "Rh-negative" and "Rh-positive" refer to whether or not the blood has a specific protein found on the surface of red blood cells (Rh factor). Follow these instructions at home: Medicines   Take over-the-counter and prescription medicines only as told by your health care provider.  If you were prescribed antibiotic medicine, take it as told by your health care provider. Do not stop taking the antibiotic even if you start to feel better.  Do not take NSAIDs, such as aspirin and ibuprofen, unless they are approved by your health care provider. These medicines can cause bleeding. Activity  Rest as directed. Ask your health care provider what activities are safe for you.  Have someone help with home and family responsibilities during this time. General instructions  Keep track of the number of sanitary pads you use each day and how soaked (saturated) they are. Write down this information.  Monitor the amount of tissue or blood clots that   you pass from your vagina. Save any large amounts of tissue for your health care provider to examine.  Do not use tampons, douche, or have sex until your health care provider approves.  To help you and your partner with the process of grieving, talk with your health care provider or seek counseling.  When you are ready, meet with  your health care provider to discuss any important steps you should take for your health. Also, discuss steps you should take to have a healthy pregnancy in the future.  Keep all follow-up visits as told by your health care provider. This is important. Where to find more information  The American Congress of Obstetricians and Gynecologists: www.acog.org  [place] Department of Health and Cytogeneticist of Women's Health: http://hoffman.com/ Contact a health care provider if:  You have a fever or chills.  You have a foul smelling vaginal discharge.  You have more bleeding instead of less. Get help right away if:  You have severe cramps or pain in your back or abdomen.  You pass blood clots or tissue from your vagina that is walnut-sized or larger.  You soak more than 1 regular sanitary pad in an hour.  You become light-headed or weak.  You pass out.  You have feelings of sadness that take over your thoughts, or you have thoughts of hurting yourself. Summary  Most miscarriages happen in the first 3 months of pregnancy. Sometimes miscarriage happens before a woman even knows that she is pregnant.  Follow your health care provider's instruction for home care. Keep all follow-up appointments.  To help you and your partner with the process of grieving, talk with your health care provider or seek counseling. This information is not intended to replace advice given to you by your health care provider. Make sure you discuss any questions you have with your health care provider. Document Revised: 05/08/2018 Document Reviewed: 02/20/2016 Elsevier Patient Education  2020 [employer]  Thyroxine Test Why am I having this test? The thyroid is a gland in the lower front of the neck. It makes hormones that affect many body parts and systems, including the system that affects how quickly the body burns fuel for energy (metabolism). You may have a thyroxine test:  To help manage  treatment for an underactive thyroid (hypothyroidism) or an overactive thyroid (hyperthyroidism).  To help diagnose hypothyroidism or hyperthyroidism. If you have possible symptoms of these conditions, you may have this test done. ? Symptoms of hypothyroidism include:  Fatigue.  Weight gain.  Dry skin.  Cold intolerance. ? Symptoms of hyperthyroidism include:  Tremors.  Weight loss.  Anxiety.  Heat intolerance.  If you are pregnant and have thyroid disease. You may have this test to make sure your hormone levels remain normal during pregnancy.  To help diagnose other conditions that affect thyroid function. Newborn babies may have this test done to screen for hypothyroidism that is present at birth (congenital). What is being tested? This test measures the amount of total thyroxine (T4) in the blood. T4 is the main hormone made by the thyroid. Some T4 is attached (bound) to proteins in the blood. Some remains free (free T4). Your health care provider may test you for total T4, free T4, or both. What kind of sample is taken?     A blood sample is required for this test. It is usually collected by inserting a needle into a blood vessel. For newborns, a small amount of blood may be collected from  the umbilical cord, or by using a small needle to prick the heel (heel stick). How do I prepare for this test? Follow instructions from your health care provider about changing or stopping your regular medicines. Many medicines can affect thyroid hormones, including birth control pills, estrogen, and aspirin. Tell a health care provider about:  All medicines you are taking, including vitamins, herbs, eye drops, creams, and over-the-counter medicines.  Any blood disorders you have.  Any surgeries you have had.  Any medical conditions you have.  Whether you are pregnant or may be pregnant.  Any recent illness or stress. How are the results reported? Your test results will be  reported as a value that indicates how much T4 is in your blood. Your health care provider will compare your results to normal ranges that were established after testing a large group of people (reference ranges). Reference ranges may vary among labs and hospitals. Reference ranges for free T4 vary by age. Common reference ranges are:  69-4 days old: 2-6 ng/dL or 41-93 pmol/L (SI units).  2 weeks to 34 years old: 0.8-2 ng/dL or 79-02 pmol/L (SI units).  Adult: 0.8-2.8 ng/dL or 40-97 pmol/L (SI units). Reference ranges for total T4 vary by age and gender. Common reference ranges are:  59-74 days old: 11-22 mcg/dL.  70-65 weeks old: 10-16 mcg/dL.  1-12 months old: 8-16 mcg/dL.  79-89 years old: 7-15 mcg/dL.  54-56 years old: 6-13 mcg/dL.  28-64 years old: 5-12 mcg/dL.  Adult female: 4-12 mcg/dL.  Adult female: 5-12 mcg/dL.  Any adult older than 60 years: 5-11 mcg/dL.  Pregnant female: 9-14 mcg/dL. What do the results mean? Results that are within your reference range are considered normal. This means that you have a normal amount of T4 in your blood. Results that are higher than your reference range mean that you have too much T4 in your blood. This may mean that:  You have hyperthyroidism.  You have a thyroid condition called Graves' disease.  You have other conditions that affect your thyroid function, such as thyroid cancer, thyroid goiter, or thyroiditis.  Your thyroid medicine dosage is too low. Results that are lower than your reference range mean that you have too little T4 in your blood. This may mean that:  You have hypothyroidism.  You have problems with your pituitary gland function.  You have a thyroid condition called Hashimoto's thyroiditis.  You have kidney failure.  Your thyroid medicine dosage is too high. You will need more tests to confirm a diagnosis. Talk with your health care provider about what your results mean. Questions to ask your health care  provider Ask your health care provider, or the department that is doing the test:  When will my results be ready?  How will I get my results?  What are my treatment options?  What other tests do I need?  What are my next steps? Summary  The thyroid is a gland in the lower front of the neck. It makes hormones that affect many body parts and systems, including the system that affects how quickly your body burns fuel for energy (metabolism).  This test measures the amount of total thyroxine (T4) in your blood. T4 is the main hormone made by your thyroid.  Some T4 is attached (bound) to proteins in your blood. Some remains free (free T4). Your health care provider may test you for total T4, free T4, or both. This information is not intended to replace advice given to you by  your health care provider. Make sure you discuss any questions you have with your health care provider. Document Revised: 04/14/2017 Document Reviewed: 10/15/2016 Elsevier Patient Education  2020 [employer].

## 2020-01-20 NOTE — Progress Notes (Signed)
GYN ENCOUNTER NOTE  Subjective:       Jenny Mcguire is a 34 y.o. G58P2002 female is here for gynecologic evaluation of the following issues:  1. Vaginal bleeding after positive home pregnancy test on 01/11/2020; bleeding and low back pain started three (3) days ago   Notes similar symptoms earlier this year. Questions chemical pregnancy or low progesterone levels.   Denies difficulty breathing or respiratory distress, chest pain, abdominal pain, dysuria, and leg pain or swelling.    Gynecologic History  Patient's last menstrual period was 12/18/2019.  Contraception: none  Last Pap: 01/2017. Results were: normal  Obstetric History  OB History  Gravida Para Term Preterm AB Living  2 2 2     2   SAB IAB Ectopic Multiple Live Births        0 2    # Outcome Date GA Lbr Len/2nd Weight Sex Delivery Anes PTL Lv  2 Term 09/29/16 [redacted]w[redacted]d / 00:12 7 lb 0.2 oz (3.18 kg) F Vag-Spont EPI  LIV  1 Term 2011    F Vag-Spont  N LIV    Past Medical History:  Diagnosis Date  . Amenorrhea   . History of UTI     Past Surgical History:  Procedure Laterality Date  . BREAST ENHANCEMENT SURGERY      Current Outpatient Medications on File Prior to Visit  Medication Sig Dispense Refill  . Multiple Vitamin (MULTI-VITAMIN) tablet Take by mouth.    . prenatal vitamin w/FE, FA (PRENATAL 1 + 1) 27-1 MG TABS tablet Take 1 tablet by mouth daily at 12 noon.     No current facility-administered medications on file prior to visit.    Allergies  Allergen Reactions  . Sulfa Antibiotics     Social History   Socioeconomic History  . Marital status: Married    Spouse name: Not on file  . Number of children: Not on file  . Years of education: Not on file  . Highest education level: Not on file  Occupational History  . Not on file  Tobacco Use  . Smoking status: Never Smoker  . Smokeless tobacco: Never Used  Vaping Use  . Vaping Use: Never used  Substance and Sexual Activity  . Alcohol use:  Not Currently    Comment: occas/ not during pregnancy  . Drug use: No  . Sexual activity: Yes    Birth control/protection: Condom  Other Topics Concern  . Not on file  Social History Narrative  . Not on file   Social Determinants of Health   Financial Resource Strain: Not on file  Food Insecurity: Not on file  Transportation Needs: Not on file  Physical Activity: Not on file  Stress: Not on file  Social Connections: Not on file  Intimate Partner Violence: Not on file    Family History  Problem Relation Age of Onset  . COPD Mother   . Cancer Neg Hx   . Heart failure Neg Hx   . Diabetes Neg Hx     The following portions of the patient's history were reviewed and updated as appropriate: allergies, current medications, past family history, past medical history, past social history, past surgical history and problem list.  Review of Systems  ROS negative except as noted above. Information obtained from patient.   Objective:   BP 110/76   Pulse 93   Ht 5\' 2"  (1.575 m)   Wt 144 lb 9.6 oz (65.6 kg)   LMP 12/18/2019   BMI 26.45  kg/m    CONSTITUTIONAL: Well-developed, well-nourished female in no acute distress.   Labs:  Recent Results (from the past 2160 hour(s))  Urine Culture     Status: None   Collection Time: 01/12/20 11:53 AM   Specimen: Urine   UR  Result Value Ref Range   Urine Culture, Routine Final report    Organism ID, Bacteria No growth   POCT urinalysis dipstick     Status: Abnormal   Collection Time: 01/12/20  1:52 PM  Result Value Ref Range   Color, UA yellow    Clarity, UA clear    Glucose, UA Negative Negative   Bilirubin, UA neg    Ketones, UA neg    Spec Grav, UA <=1.005 (A) 1.010 - 1.025   Blood, UA neg    pH, UA 5.0 5.0 - 8.0   Protein, UA Negative Negative   Urobilinogen, UA 0.2 0.2 or 1.0 E.U./dL   Nitrite, UA neg    Leukocytes, UA Negative Negative   Appearance     Odor    POCT urine pregnancy     Status: None   Collection Time:  01/12/20  1:54 PM  Result Value Ref Range   Preg Test, Ur Negative Negative  Beta HCG, Quant     Status: None   Collection Time: 01/17/20  1:51 PM  Result Value Ref Range   hCG Quant 4 mIU/mL    Comment:                      Female (Non-pregnant)    0 -     5                             (Postmenopausal)  0 -     8                      Female (Pregnant)                      Weeks of Gestation                              3                6 -    71                              4               10 -   750                              5              217 -  7138                              6              158 - 31795                              7             [id number] -[id number]  8            32065 -[id number]                              9            63803 -151410                             10            46509 -166063                             12            27832 -210612                             14            13950 - 62530                             15            12039 - 70971                             16             9040 - 56451                             17             8175 - 55868                             18             8099 - 58176 Roche E CLIA methodology      Assessment:   1. Threatened abortion  - Beta hCG quant (ref lab) - Progesterone - TSH+T4F+T3Free  2. Positive pregnancy test  - Beta hCG quant (ref lab) - Progesterone - TSH+T4F+T3Free  3. Vaginal bleeding affecting early pregnancy   Plan:   Labs today, see orders. Will repeat progesterone level during cycle.   Discussed miscarriage, see AVS.   Reviewed red flag symptoms and when to call.   RTC as needed.    Serafina Royals, CNM [facility], [facility] 01/20/20 11:54 AM

## 2020-01-20 NOTE — Progress Notes (Signed)
Pt states she had a (+) HPT. Came in on 12/21 for Beta hcg. C/o spotting which began 3 days, flow increased yesterday, bright red.

## 2020-01-21 LAB — TSH+T4F+T3FREE
Free T4: 1.26 ng/dL (ref 0.82–1.77)
T3, Free: 2.9 pg/mL (ref 2.0–4.4)
TSH: 2.41 u[IU]/mL (ref 0.450–4.500)

## 2020-01-21 LAB — PROGESTERONE: Progesterone: 0.2 ng/mL

## 2020-01-21 LAB — BETA HCG QUANT (REF LAB): hCG Quant: <1 m[IU]/mL

## 2020-01-29 NOTE — L&D Delivery Note (Signed)
Delivery Summary for Jenny Mcguire  Labor Events:   Preterm labor: No data found  Rupture date: 12/13/2020  Rupture time: 10:00 AM  Rupture type: Spontaneous  Fluid Color: Clear  Induction: No data found  Augmentation: No data found  Complications: No data found  Cervical ripening: No data found No data found   No data found     Delivery:   Episiotomy: No data found  Lacerations: No data found  Repair suture: No data found  Repair # of packets: No data found  Blood loss (ml): 150   Information for the patient's newborn:  Jasma, Seevers [[id number]]   Delivery 12/13/2020 4:02 PM by  Vaginal, Spontaneous Sex:  female Gestational Age: [redacted]w[redacted]d Delivery Clinician:   Living?:         APGARS  One minute Five minutes Ten minutes  Skin color:        Heart rate:        Grimace:        Muscle tone:        Breathing:        Totals: 8  9      Presentation/position:      Resuscitation:   Cord information:    Disposition of cord blood:     Blood gases sent?  Complications:   Placenta: Delivered:       appearance Newborn Measurements: Weight: 7 lb 6.9 oz (3370 g)  Height: 20.75"  Head circumference:    Chest circumference:    Other providers:    Additional  information: Forceps:   Vacuum:   Breech:   Observed anomalies       Delivery Note At 4:02 PM a viable and healthy female was delivered via Vaginal, Spontaneous (Presentation: Left Occiput Anterior).  APGAR: 8, 9; weight 7 lb 6.9 oz (3370 g).  Placenta status: Spontaneous, Intact.  Cord: 3 vessels with the following complications: None.  Cord pH: not obtained. Delayed cord clamping observed.   Anesthesia: Epidural Episiotomy: None Lacerations: None Suture Repair:  None Est. Blood Loss (mL): 150  Mom to postpartum.  Baby to Couplet care / Skin to Skin.  Hildred Laser, MD 12/13/2020, 5:27 PM

## 2020-01-31 ENCOUNTER — Other Ambulatory Visit: Payer: Self-pay | Admitting: Certified Nurse Midwife

## 2020-01-31 DIAGNOSIS — Z319 Encounter for procreative management, unspecified: Secondary | ICD-10-CM

## 2020-01-31 NOTE — Progress Notes (Signed)
Progesterone level, see orders.    Serafina Royals, CNM  [facility], [facility] 01/31/20 5:19 PM

## 2020-01-31 NOTE — Progress Notes (Signed)
Progesterone level, see orders.    Serafina Royals, CNM [facility], [facility] 01/31/20 5:18 PM

## 2020-02-15 NOTE — Telephone Encounter (Signed)
Please contact patient to place on Thursday schedule for labs. Thanks, JML

## 2020-02-17 ENCOUNTER — Other Ambulatory Visit: Payer: Self-pay | Admitting: Certified Nurse Midwife

## 2020-02-17 ENCOUNTER — Other Ambulatory Visit: Payer: BC Managed Care – PPO

## 2020-02-17 ENCOUNTER — Other Ambulatory Visit: Payer: Self-pay

## 2020-02-17 DIAGNOSIS — Z319 Encounter for procreative management, unspecified: Secondary | ICD-10-CM | POA: Diagnosis not present

## 2020-02-17 DIAGNOSIS — Z8759 Personal history of other complications of pregnancy, childbirth and the puerperium: Secondary | ICD-10-CM | POA: Diagnosis not present

## 2020-02-17 DIAGNOSIS — N926 Irregular menstruation, unspecified: Secondary | ICD-10-CM

## 2020-02-17 NOTE — Progress Notes (Signed)
Beta ordered, see labos.    Serafina Royals, CNM [facility], [facility] 02/17/20 9:12 AM

## 2020-02-18 ENCOUNTER — Other Ambulatory Visit: Payer: Self-pay | Admitting: Certified Nurse Midwife

## 2020-02-18 DIAGNOSIS — N926 Irregular menstruation, unspecified: Secondary | ICD-10-CM

## 2020-02-18 DIAGNOSIS — R7989 Other specified abnormal findings of blood chemistry: Secondary | ICD-10-CM

## 2020-02-18 DIAGNOSIS — Z8759 Personal history of other complications of pregnancy, childbirth and the puerperium: Secondary | ICD-10-CM

## 2020-02-18 LAB — BETA HCG QUANT (REF LAB): hCG Quant: 7 m[IU]/mL

## 2020-02-18 LAB — PROGESTERONE: Progesterone: 4.5 ng/mL

## 2020-02-18 MED ORDER — PROGESTERONE 200 MG PO CAPS: 200.0000 mg | ORAL_CAPSULE | Freq: Every day | ORAL | 2 refills | Status: DC

## 2020-02-18 NOTE — Telephone Encounter (Signed)
Please contact patient to schedule lab appointments on either Monday/Wednesday or Tuesday/Thursday. Thanks, JML

## 2020-02-18 NOTE — Telephone Encounter (Signed)
Please advise. Thanks Eldean Nanna 

## 2020-02-18 NOTE — Progress Notes (Signed)
Labs: Beta and progesterone, see orders.    Jenny Mcguire, CNM [facility], [facility] 02/18/20 5:47 PM

## 2020-02-22 ENCOUNTER — Other Ambulatory Visit: Payer: BC Managed Care – PPO

## 2020-02-22 ENCOUNTER — Other Ambulatory Visit: Payer: Self-pay

## 2020-02-22 DIAGNOSIS — R7989 Other specified abnormal findings of blood chemistry: Secondary | ICD-10-CM | POA: Diagnosis not present

## 2020-02-22 DIAGNOSIS — N926 Irregular menstruation, unspecified: Secondary | ICD-10-CM | POA: Diagnosis not present

## 2020-02-22 DIAGNOSIS — Z8759 Personal history of other complications of pregnancy, childbirth and the puerperium: Secondary | ICD-10-CM | POA: Diagnosis not present

## 2020-02-23 LAB — PROGESTERONE: Progesterone: 0.2 ng/mL

## 2020-02-23 LAB — BETA HCG QUANT (REF LAB): hCG Quant: <1 m[IU]/mL

## 2020-02-28 ENCOUNTER — Ambulatory Visit (INDEPENDENT_AMBULATORY_CARE_PROVIDER_SITE_OTHER): Payer: BC Managed Care – PPO | Admitting: Certified Nurse Midwife

## 2020-02-28 ENCOUNTER — Other Ambulatory Visit: Payer: Self-pay

## 2020-02-28 ENCOUNTER — Other Ambulatory Visit (HOSPITAL_COMMUNITY)
Admission: RE | Admit: 2020-02-28 | Discharge: 2020-02-28 | Disposition: A | Discharge status: Home / Self Care | Payer: BC Managed Care – PPO | Source: Ambulatory Visit | Attending: Certified Nurse Midwife | Admitting: Certified Nurse Midwife

## 2020-02-28 ENCOUNTER — Encounter: Payer: Self-pay | Admitting: Certified Nurse Midwife

## 2020-02-28 VITALS — BP 112/76 | HR 95 | Ht 62.0 in | Wt 142.8 lb

## 2020-02-28 DIAGNOSIS — Z124 Encounter for screening for malignant neoplasm of cervix: Secondary | ICD-10-CM | POA: Diagnosis not present

## 2020-02-28 DIAGNOSIS — N96 Recurrent pregnancy loss: Secondary | ICD-10-CM | POA: Diagnosis not present

## 2020-02-28 DIAGNOSIS — Z01419 Encounter for gynecological examination (general) (routine) without abnormal findings: Secondary | ICD-10-CM | POA: Diagnosis not present

## 2020-02-28 DIAGNOSIS — N978 Female infertility of other origin: Secondary | ICD-10-CM | POA: Insufficient documentation

## 2020-02-28 MED ORDER — ASPIRIN EC 81 MG PO TBEC: 81.0000 mg | DELAYED_RELEASE_TABLET | Freq: Every day | ORAL | 2 refills | Status: DC

## 2020-02-28 NOTE — Progress Notes (Signed)
Pt present for annual exam. Pt stated that she had 2 miscarriages in the last two months, both she had a positive pregnancy test then started bleeding days later. One date was 01/18/2020 and the other one was 02/18/2020.  Pt declined flu vaccine.

## 2020-02-28 NOTE — Progress Notes (Signed)
ANNUAL PREVENTATIVE CARE GYN  ENCOUNTER NOTE  Subjective:       Jenny Mcguire is a 35 y.o. [phone] female here for a routine annual gynecologic exam and pap.  Current complaints: 1.  Recent miscarriages and plan for conception   Denies difficulty breathing, respiratory distress, chest pain, abdominal pain, and leg swelling and pain.   Gynecologic History  No LMP recorded (lmp unknown). Miscarriage, bleeding started 02/18/2020  Contraception: none  Last Pap: 02/12/17 due. Results were: Neg.  Obstetric History  OB History  Gravida Para Term Preterm AB Living  2 2 2     2   SAB IAB Ectopic Multiple Live Births        0 2    # Outcome Date GA Lbr Len/2nd Weight Sex Delivery Anes PTL Lv  2 Term 09/29/16 [redacted]w[redacted]d / 00:12 3180 g F Vag-Spont EPI  LIV  1 Term 2011    F Vag-Spont  N LIV    Past Medical History:  Diagnosis Date  . Amenorrhea   . History of UTI     Past Surgical History:  Procedure Laterality Date  . BREAST ENHANCEMENT SURGERY      Current Outpatient Medications on File Prior to Visit  Medication Sig Dispense Refill  . Multiple Vitamin (MULTI-VITAMIN) tablet Take by mouth.    . prenatal vitamin w/FE, FA (PRENATAL 1 + 1) 27-1 MG TABS tablet Take 1 tablet by mouth daily at 12 noon.    . progesterone (PROMETRIUM) 200 MG capsule Place 1 capsule (200 mg total) vaginally daily. 30 capsule 2   No current facility-administered medications on file prior to visit.    Allergies  Allergen Reactions  . Sulfa Antibiotics     Social History   Socioeconomic History  . Marital status: Married    Spouse name: Not on file  . Number of children: Not on file  . Years of education: Not on file  . Highest education level: Not on file  Occupational History  . Not on file  Tobacco Use  . Smoking status: Never Smoker  . Smokeless tobacco: Never Used  Vaping Use  . Vaping Use: Never used  Substance and Sexual Activity  . Alcohol use: Not Currently    Comment: occas/ not  during pregnancy  . Drug use: No  . Sexual activity: Yes  Other Topics Concern  . Not on file  Social History Narrative  . Not on file   Social Determinants of Health   Financial Resource Strain: Not on file  Food Insecurity: Not on file  Transportation Needs: Not on file  Physical Activity: Not on file  Stress: Not on file  Social Connections: Not on file  Intimate Partner Violence: Not on file    Family History  Problem Relation Age of Onset  . COPD Mother   . Cancer Neg Hx   . Heart failure Neg Hx   . Diabetes Neg Hx     The following portions of the patient's history were reviewed and updated as appropriate: allergies, current medications, past family history, past medical history, past social history, past surgical history and problem list.  Review of Systems  ROS- Negative other than what was reported above. Information obtained verbally from patient.    Objective:   BP 112/76   Pulse 95   Ht 5\' 2"  (1.575 m)   Wt 64.8 kg   LMP  (LMP Unknown)   BMI 26.12 kg/m    CONSTITUTIONAL: Well-developed, well-nourished female in no  acute distress.   PSYCHIATRIC: Normal mood and affect. Normal behavior. Normal judgment and thought content.  NEUROLGIC: Alert and oriented to person, place, and time. Normal muscle tone coordination. No cranial nerve deficit noted.  NECK: Normal range of motion, supple, no masses.  Normal thyroid.   SKIN: Skin is warm and dry. No rash noted. Not diaphoretic. No erythema. No pallor.  CARDIOVASCULAR: Normal heart rate noted, regular rhythm, no murmur.  RESPIRATORY: Clear to auscultation bilaterally. Effort and breath sounds normal, no problems with respiration noted.  BREASTS: Symmetric in size. No masses, skin changes, nipple drainage, or lymphadenopathy. Breast implants noted under muscle.  ABDOMEN: Soft, normal bowel sounds, no distention noted.  No tenderness, rebound or guarding.   PELVIC:  External Genitalia: Normal  BUS:  Normal  Vagina: Normal  Cervix: Normal, pap collected  Uterus: Normal  Adnexa: Normal   MUSCULOSKELETAL: Normal range of motion. No tenderness.  No cyanosis, clubbing, or edema.   Assessment:   Annual gynecologic examination 35 y.o.   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     Other   History of multiple miscarriages    Other Visit Diagnoses    Encounter for well woman exam with routine gynecological exam    -  Primary   Relevant Orders   Cytology - PAP   Screening for cervical cancer       Relevant Orders   Cytology - PAP      Plan:   Pap: Pap Co Test  Labs: Declined labs   Routine preventative health maintenance measures emphasized: Exercise/Diet/Weight control, Tobacco Warnings, Alcohol/Substance use risks, Stress Management and Peer Pressure Issues; see AVS  Discussed starting Rx Progesterone after ovulation when trying to conceive  Advised to take Daily Asprin, see orders   Reviewed red flag symptoms and when to call  RTC x 1 year for ANNUAL exam or sooner if needed   Juliann Pares, Student-MidWife  [employer] 02/28/20 9:31 AM

## 2020-02-28 NOTE — Progress Notes (Signed)
I have seen, interviewed, and examined the patient in conjunction with the [employer] and affirm the diagnosis and management plan.   Gunnar Bulla, CNM [facility], [facility] 02/28/20 10:53 AM

## 2020-02-28 NOTE — Patient Instructions (Addendum)
Progesterone vaginal insert What is this medicine? PROGESTERONE (proe JES ter one) is a female hormone. This medicine is used to help women who are going through an assisted reproductive technology (ART) treatment program to become pregnant. This medicine may be used for other purposes; ask your health care provider or pharmacist if you have questions. COMMON BRAND NAME(S): Endometrin What should I tell my health care provider before I take this medicine? They need to know if you have any of these conditions:  blood vessel disease, blood clotting disorder, or suffered a stroke  breast, cervical or vaginal cancer  heart disease  kidney disease  liver disease  miscarriage or abortion  vaginal bleeding  an unusual or allergic reaction to progesterone, other hormones, medicines, foods, dyes, or preservatives  pregnant or trying to get pregnant  breast-feeding How should I use this medicine? This medicine is for vaginal use only. Do not take by mouth. Follow the directions on the prescription label. Wash your hands before and after use. Take your medicine at regular intervals. Do not take it more often than directed. Do not stop taking except on your doctor's advice. Talk to your pediatrician regarding the use of this medicine in children. This medicine is not approved for use in children. Overdosage: If you think you have taken too much of this medicine contact a poison control center or emergency room at once. NOTE: This medicine is only for you. Do not share this medicine with others. What if I miss a dose? If you miss a dose, use it as soon as you can. If it is almost time for your next dose, use only that dose. Do not use double or extra doses. What may interact with this medicine?  carbamazepine  rifampin Do not use any other vaginal products without asking your doctor or health care professional. This list may not describe all possible interactions. Give your health care  provider a list of all the medicines, herbs, non-prescription drugs, or dietary supplements you use. Also tell them if you smoke, drink alcohol, or use illegal drugs. Some items may interact with your medicine. What should I watch for while using this medicine? Visit your doctor or health care professional for a regular check on your progress. This medicine can cause swelling, tenderness, or bleeding of the gums. Be careful when brushing and flossing teeth. See your dentist regularly for routine dental care. What side effects may I notice from receiving this medicine? Side effects that you should report to your doctor or health care professional as soon as possible:  abnormal vaginal bleeding  allergic reactions like skin rash, itching or hives, swelling of the face, lips, or tongue  breast tissue changes or discharge  changes in vision  chest pain  confusion, trouble speaking or understanding  dark urine  general ill feeling or flu-like symptoms  light-colored stools  loss of appetite  pain, swelling, warmth in the leg  right upper belly pain  severe headaches  shortness of breath  sudden numbness or weakness of the face, arm or leg  trouble walking, dizziness, loss of balance or coordination  unusually weak or tired  yellowing of the eyes or skin Side effects that usually do not require medical attention (report to your doctor or health care professional if they continue or are bothersome):  back pain  depressed mood or mood swings  increased appetite  fluid retention and swelling  nausea, vomiting  stomach cramps or bloating This list may not describe all  possible side effects. Call your doctor for medical advice about side effects. You may report side effects to FDA at 1-800-FDA-1088. Where should I keep my medicine? Keep out of the reach of children. Store at room temperature between 15 and 30 degrees C (59 and 86 degrees F). Throw away any unused  medicine after the expiration date. NOTE: This sheet is a summary. It may not cover all possible information. If you have questions about this medicine, talk to your doctor, pharmacist, or health care provider.  2021 Elsevier/Gold Standard (2008-01-13 13:14:05)    Preventive Care 3-82 Years Old, Female Preventive care refers to lifestyle choices and visits with your health care provider that can promote health and wellness. This includes:  A yearly physical exam. This is also called an annual wellness visit.  Regular dental and eye exams.  Immunizations.  Screening for certain conditions.  Healthy lifestyle choices, such as: ? Eating a healthy diet. ? Getting regular exercise. ? Not using drugs or products that contain nicotine and tobacco. ? Limiting alcohol use. What can I expect for my preventive care visit? Physical exam Your health care provider may check your:  Height and weight. These may be used to calculate your BMI (body mass index). BMI is a measurement that tells if you are at a healthy weight.  Heart rate and blood pressure.  Body temperature.  Skin for abnormal spots. Counseling Your health care provider may ask you questions about your:  Past medical problems.  Family's medical history.  Alcohol, tobacco, and drug use.  Emotional well-being.  Home life and relationship well-being.  Sexual activity.  Diet, exercise, and sleep habits.  Work and work Statistician.  Access to firearms.  Method of birth control.  Menstrual cycle.  Pregnancy history. What immunizations do I need? Vaccines are usually given at various ages, according to a schedule. Your health care provider will recommend vaccines for you based on your age, medical history, and lifestyle or other factors, such as travel or where you work.   What tests do I need? Blood tests  Lipid and cholesterol levels. These may be checked every 5 years starting at age 31.  Hepatitis C  test.  Hepatitis B test. Screening  Diabetes screening. This is done by checking your blood sugar (glucose) after you have not eaten for a while (fasting).  STD (sexually transmitted disease) testing, if you are at risk.  BRCA-related cancer screening. This may be done if you have a family history of breast, ovarian, tubal, or peritoneal cancers.  Pelvic exam and Pap test. This may be done every 3 years starting at age 9. Starting at age 69, this may be done every 5 years if you have a Pap test in combination with an HPV test. Talk with your health care provider about your test results, treatment options, and if necessary, the need for more tests.   Follow these instructions at home: Eating and drinking  Eat a healthy diet that includes fresh fruits and vegetables, whole grains, lean protein, and low-fat dairy products.  Take vitamin and mineral supplements as recommended by your health care provider.  Do not drink alcohol if: ? Your health care provider tells you not to drink. ? You are pregnant, may be pregnant, or are planning to become pregnant.  If you drink alcohol: ? Limit how much you have to 0-1 drink a day. ? Be aware of how much alcohol is in your drink. In the [place] one drink equals one 12  oz bottle of beer (355 mL), one 5 oz glass of wine (148 mL), or one 1 oz glass of hard liquor (44 mL).   Lifestyle  Take daily care of your teeth and gums. Brush your teeth every morning and night with fluoride toothpaste. Floss one time each day.  Stay active. Exercise for at least 30 minutes 5 or more days each week.  Do not use any products that contain nicotine or tobacco, such as cigarettes, e-cigarettes, and chewing tobacco. If you need help quitting, ask your health care provider.  Do not use drugs.  If you are sexually active, practice safe sex. Use a condom or other form of protection to prevent STIs (sexually transmitted infections).  If you do not wish to become  pregnant, use a form of birth control. If you plan to become pregnant, see your health care provider for a prepregnancy visit.  Find healthy ways to cope with stress, such as: ? Meditation, yoga, or listening to music. ? Journaling. ? Talking to a trusted person. ? Spending time with friends and family. Safety  Always wear your seat belt while driving or riding in a vehicle.  Do not drive: ? If you have been drinking alcohol. Do not ride with someone who has been drinking. ? When you are tired or distracted. ? While texting.  Wear a helmet and other protective equipment during sports activities.  If you have firearms in your house, make sure you follow all gun safety procedures.  Seek help if you have been physically or sexually abused. What's next?  Go to your health care provider once a year for an annual wellness visit.  Ask your health care provider how often you should have your eyes and teeth checked.  Stay up to date on all vaccines. This information is not intended to replace advice given to you by your health care provider. Make sure you discuss any questions you have with your health care provider. Document Revised: 09/12/2019 Document Reviewed: 09/25/2017 Elsevier Patient Education  2021 [place] Breast self-awareness is knowing how your breasts look and feel. Doing breast self-awareness is important. It allows you to catch a breast problem early while it is still small and can be treated. All women should do breast self-awareness, including women who have had breast implants. Tell your doctor if you notice a change in your breasts. What you need:  A mirror.  A well-lit room. How to do a breast self-exam A breast self-exam is one way to learn what is normal for your breasts and to check for changes. To do a breast self-exam: Look for changes 1. Take off all the clothes above your waist. 2. Stand in front of a mirror in a room with good  lighting. 3. Put your hands on your hips. 4. Push your hands down. 5. Look at your breasts and nipples in the mirror to see if one breast or nipple looks different from the other. Check to see if: ? The shape of one breast is different. ? The size of one breast is different. ? There are wrinkles, dips, and bumps in one breast and not the other. 6. Look at each breast for changes in the skin, such as: ? Redness. ? Scaly areas. 7. Look for changes in your nipples, such as: ? Liquid around the nipples. ? Bleeding. ? Dimpling. ? Redness. ? A change in where the nipples are.   Feel for changes 1. Lie on your back on the  floor. 2. Feel each breast. To do this, follow these steps: ? Pick a breast to feel. ? Put the arm closest to that breast above your head. ? Use your other arm to feel the nipple area of your breast. Feel the area with the pads of your three middle fingers by making small circles with your fingers. For the first circle, press lightly. For the second circle, press harder. For the third circle, press even harder. ? Keep making circles with your fingers at the different pressures as you move down your breast. Stop when you feel your ribs. ? Move your fingers a little toward the center of your body. ? Start making circles with your fingers again, this time going up until you reach your collarbone. ? Keep making up-and-down circles until you reach your armpit. Remember to keep using the three pressures. ? Feel the other breast in the same way. 3. Sit or stand in the tub or shower. 4. With soapy water on your skin, feel each breast the same way you did in step 2 when you were lying on the floor.   Write down what you find Writing down what you find can help you remember what to tell your doctor. Write down:  What is normal for each breast.  Any changes you find in each breast, including: ? The kind of changes you find. ? Whether you have pain. ? Size and location of any  lumps.  When you last had your menstrual period. General tips  Check your breasts every month.  If you are breastfeeding, the best time to check your breasts is after you feed your baby or after you use a breast pump.  If you get menstrual periods, the best time to check your breasts is 5-7 days after your menstrual period is over.  With time, you will become comfortable with the self-exam, and you will begin to know if there are changes in your breasts. Contact a doctor if you:  See a change in the shape or size of your breasts or nipples.  See a change in the skin of your breast or nipples, such as red or scaly skin.  Have fluid coming from your nipples that is not normal.  Find a lump or thick area that was not there before.  Have pain in your breasts.  Have any concerns about your breast health. Summary  Breast self-awareness includes looking for changes in your breasts, as well as feeling for changes within your breasts.  Breast self-awareness should be done in front of a mirror in a well-lit room.  You should check your breasts every month. If you get menstrual periods, the best time to check your breasts is 5-7 days after your menstrual period is over.  Let your doctor know of any changes you see in your breasts, including changes in size, changes on the skin, pain or tenderness, or fluid from your nipples that is not normal. This information is not intended to replace advice given to you by your health care provider. Make sure you discuss any questions you have with your health care provider. Document Revised: 09/02/2017 Document Reviewed: 09/02/2017 Elsevier Patient Education  [place].

## 2020-02-29 LAB — CYTOLOGY - PAP
Comment: NEGATIVE
Diagnosis: NEGATIVE
High risk HPV: NEGATIVE

## 2020-04-03 DIAGNOSIS — G44209 Tension-type headache, unspecified, not intractable: Secondary | ICD-10-CM | POA: Diagnosis not present

## 2020-04-03 DIAGNOSIS — M9901 Segmental and somatic dysfunction of cervical region: Secondary | ICD-10-CM | POA: Diagnosis not present

## 2020-04-03 DIAGNOSIS — M9902 Segmental and somatic dysfunction of thoracic region: Secondary | ICD-10-CM | POA: Diagnosis not present

## 2020-04-03 DIAGNOSIS — M9903 Segmental and somatic dysfunction of lumbar region: Secondary | ICD-10-CM | POA: Diagnosis not present

## 2020-04-13 ENCOUNTER — Other Ambulatory Visit: Payer: Self-pay | Admitting: Certified Nurse Midwife

## 2020-04-13 DIAGNOSIS — N926 Irregular menstruation, unspecified: Secondary | ICD-10-CM

## 2020-04-13 DIAGNOSIS — N96 Recurrent pregnancy loss: Secondary | ICD-10-CM

## 2020-04-13 DIAGNOSIS — R7989 Other specified abnormal findings of blood chemistry: Secondary | ICD-10-CM

## 2020-04-13 NOTE — Progress Notes (Signed)
Labs orders for Beta and Progesterone placed, see chart.    Serafina Royals, CNM [facility], [facility] 04/13/20 3:10 PM

## 2020-04-14 ENCOUNTER — Other Ambulatory Visit: Payer: BC Managed Care – PPO

## 2020-04-14 ENCOUNTER — Other Ambulatory Visit: Payer: Self-pay

## 2020-04-14 DIAGNOSIS — N96 Recurrent pregnancy loss: Secondary | ICD-10-CM | POA: Diagnosis not present

## 2020-04-14 DIAGNOSIS — N926 Irregular menstruation, unspecified: Secondary | ICD-10-CM | POA: Diagnosis not present

## 2020-04-14 DIAGNOSIS — R7989 Other specified abnormal findings of blood chemistry: Secondary | ICD-10-CM | POA: Diagnosis not present

## 2020-04-15 LAB — PROGESTERONE: Progesterone: 37.4 ng/mL

## 2020-04-15 LAB — BETA HCG QUANT (REF LAB): hCG Quant: 106 m[IU]/mL

## 2020-04-17 ENCOUNTER — Other Ambulatory Visit: Payer: Self-pay

## 2020-04-17 DIAGNOSIS — N96 Recurrent pregnancy loss: Secondary | ICD-10-CM

## 2020-04-17 NOTE — Telephone Encounter (Signed)
Please add to labs schedule for Friday. Order repeat beta and progesterone. Hx miscarriage. Thanks, JML

## 2020-04-21 ENCOUNTER — Other Ambulatory Visit: Payer: Self-pay

## 2020-04-21 ENCOUNTER — Other Ambulatory Visit: Payer: BC Managed Care – PPO

## 2020-04-21 DIAGNOSIS — N96 Recurrent pregnancy loss: Secondary | ICD-10-CM | POA: Diagnosis not present

## 2020-04-22 LAB — PROGESTERONE: Progesterone: 32.9 ng/mL

## 2020-04-22 LAB — BETA HCG QUANT (REF LAB): hCG Quant: 2837 m[IU]/mL

## 2020-04-24 NOTE — Telephone Encounter (Signed)
Please contact patient and schedule confirmation visit. Thanks, JML

## 2020-04-25 ENCOUNTER — Telehealth: Payer: Self-pay

## 2020-04-25 NOTE — Telephone Encounter (Signed)
Spoke to pt to see if she scheduled an appointment with JML for confirmation of pregnancy. Pt stated that she had called and scheduled an appointment.

## 2020-05-01 ENCOUNTER — Other Ambulatory Visit: Payer: Self-pay

## 2020-05-01 ENCOUNTER — Ambulatory Visit (INDEPENDENT_AMBULATORY_CARE_PROVIDER_SITE_OTHER): Payer: BC Managed Care – PPO | Admitting: Certified Nurse Midwife

## 2020-05-01 ENCOUNTER — Encounter: Payer: Self-pay | Admitting: Certified Nurse Midwife

## 2020-05-01 VITALS — BP 114/79 | HR 80 | Resp 16 | Ht 62.0 in | Wt 143.6 lb

## 2020-05-01 DIAGNOSIS — R110 Nausea: Secondary | ICD-10-CM | POA: Diagnosis not present

## 2020-05-01 DIAGNOSIS — O2620 Pregnancy care for patient with recurrent pregnancy loss, unspecified trimester: Secondary | ICD-10-CM

## 2020-05-01 DIAGNOSIS — N96 Recurrent pregnancy loss: Secondary | ICD-10-CM

## 2020-05-01 DIAGNOSIS — N644 Mastodynia: Secondary | ICD-10-CM

## 2020-05-01 DIAGNOSIS — Z3201 Encounter for pregnancy test, result positive: Secondary | ICD-10-CM

## 2020-05-01 LAB — POCT URINE PREGNANCY: Preg Test, Ur: POSITIVE — AB

## 2020-05-01 MED ORDER — PROGESTERONE 200 MG PO CAPS: 400.0000 mg | ORAL_CAPSULE | Freq: Every day | ORAL | 2 refills | Status: DC

## 2020-05-01 NOTE — Progress Notes (Signed)
I have seen, interviewed, and examined the patient in conjunction with the [employer] and affirm the diagnosis and management plan.   Gunnar Bulla, CNM [facility], [facility] 05/01/20 4:53 PM

## 2020-05-01 NOTE — Progress Notes (Signed)
GYN ENCOUNTER NOTE  Subjective:       Jenny Mcguire is a 35 y.o. G50P2002 female here for pregnancy confirmation.   Reports missed period with positive home pregnancy test.  Endorses nausea without vomiting, breast tenderness, and bloating.  History significant for multiple miscarriage, currently taking progesterone supplement.   Denies respiratory distress, breathing difficulty, chest pain, vaginal bleeding, and leg pain or swelling.  Gynecologic History  Patient's last menstrual period was 03/20/2020.  Period Duration (Days): 4-5 Period Pattern: Regular Menstrual Flow: Moderate Menstrual Control: Thin pad,Tampon Dysmenorrhea: (!) Mild Dysmenorrhea Symptoms: Cramping   Gestational age: 55 weeks 0 days  Estimated date of birth: 12/25/2020  Contraception: none   Last Pap: 02/28/20 Results were: Neg/Neg  Obstetric History  OB History  Gravida Para Term Preterm AB Living  2 2 2     2   SAB IAB Ectopic Multiple Live Births        0 2    # Outcome Date GA Lbr Len/2nd Weight Sex Delivery Anes PTL Lv  2 Term 09/29/16 [redacted]w[redacted]d / 00:12 3180 g F Vag-Spont EPI  LIV  1 Term 2011    F Vag-Spont  N LIV    Past Medical History:  Diagnosis Date  . Amenorrhea   . History of UTI     Past Surgical History:  Procedure Laterality Date  . BREAST ENHANCEMENT SURGERY      Current Outpatient Medications on File Prior to Visit  Medication Sig Dispense Refill  . aspirin EC 81 MG tablet Take 1 tablet (81 mg total) by mouth daily. 300 tablet 2  . Multiple Vitamin (MULTI-VITAMIN) tablet Take by mouth.    . prenatal vitamin w/FE, FA (PRENATAL 1 + 1) 27-1 MG TABS tablet Take 1 tablet by mouth daily at 12 noon.     No current facility-administered medications on file prior to visit.    Allergies  Allergen Reactions  . Sulfa Antibiotics     Social History   Socioeconomic History  . Marital status: Married    Spouse name: Not on file  . Number of children: Not on file  . Years  of education: Not on file  . Highest education level: Not on file  Occupational History  . Not on file  Tobacco Use  . Smoking status: Never Smoker  . Smokeless tobacco: Never Used  Vaping Use  . Vaping Use: Never used  Substance and Sexual Activity  . Alcohol use: Not Currently    Comment: occas/ not during pregnancy  . Drug use: No  . Sexual activity: Yes  Other Topics Concern  . Not on file  Social History Narrative  . Not on file   Social Determinants of Health   Financial Resource Strain: Not on file  Food Insecurity: Not on file  Transportation Needs: Not on file  Physical Activity: Not on file  Stress: Not on file  Social Connections: Not on file  Intimate Partner Violence: Not on file    Family History  Problem Relation Age of Onset  . COPD Mother   . Cancer Neg Hx   . Heart failure Neg Hx   . Diabetes Neg Hx     The following portions of the patient's history were reviewed and updated as appropriate: allergies, current medications, past family history, past medical history, past social history, past surgical history and problem list.  Review of Systems  ROS- Negative other than what was reported above. Information obtained verbally from patient.   Objective:  BP 114/79   Pulse 80   Resp 16   Ht  (1.575 m)   Wt 65.1 kg   LMP 03/20/2020   BMI 26.26 kg/m    CONSTITUTIONAL: Well-developed, well-nourished female in no acute distress.   PHYSICAL EXAM: Not indicated.   Recent Results (from the past 2160 hour(s))  Progesterone     Status: None   Collection Time: 04/14/20  8:23 AM  Result Value Ref Range   Progesterone 37.4 ng/mL    Comment:                      Follicular phase       0.1 -   0.9                      Luteal phase           1.8 -  23.9                      Ovulation phase        0.1 -  12.0                      Pregnant                         First trimester    11.0 -  44.3                         Second trimester   25.4 -   83.3                         Third trimester    58.7 - 214.0                      Postmenopausal         0.0 -   0.1   Beta hCG quant (ref lab)     Status: None   Collection Time: 04/14/20  8:23 AM  Result Value Ref Range   hCG Quant 106 mIU/mL    Comment:                      Female (Non-pregnant)    0 -     5                             (Postmenopausal)  0 -     8                      Female (Pregnant)                      Weeks of Gestation                              3                6 -    71                              4               10 -   750  5              217 -  7138                              6              158 - 31795                              7             [id number] [id number]                              8            32065 -149571                              9            63803 -[phone]                             10            46509 -[phone]                             12            27832 -[phone]                             14            13950 - 62530                             15            12039 - 70971                             16             9040 - 56451                             17             [phone] - [phone] Roche E CLIA methodology   Beta hCG quant (ref lab)     Status: None   Collection Time: 04/21/20  8:19 AM  Result Value Ref Range   hCG Quant 2,837 mIU/mL    Comment:                      Female (Non-pregnant)    0 -     5                             (Postmenopausal)  0 -  8                      Female (Pregnant)                      Weeks of Gestation                              3                6 -    71                              4               10 -   750                              5              217 -  7138                              6              158 - 31795                              7             [phone]                              8             32065 -149571                              9            63803 -151410                             10            46509 [phone]                             12            [phone]                             14            13950 - [phone]                             15            12039 - 70971                             16             9040 - 56451  17             8175 - [phone]                             18             8099 - [phone] Roche E CLIA methodology   Progesterone     Status: None   Collection Time: 04/21/20  8:19 AM  Result Value Ref Range   Progesterone 32.9 ng/mL    Comment:                      Follicular phase       0.1 -   0.9                      Luteal phase           1.8 -  23.9                      Ovulation phase        0.1 -  12.0                      Pregnant                         First trimester    11.0 -  44.3                         Second trimester   25.4 -  83.3                         Third trimester    58.7 - 214.0                      Postmenopausal         0.0 -   0.1   POCT urine pregnancy     Status: Abnormal   Collection Time: 05/01/20  4:21 PM  Result Value Ref Range   Preg Test, Ur Positive (A) Negative     Assessment:   1. Positive urine pregnancy test  - POCT urine pregnancy - US OB LESS THAN 14 WEEKS WITH OB TRANSVAGINAL; Future  2. History of multiple miscarriages  - US OB LESS THAN 14 WEEKS WITH OB TRANSVAGINAL; Future  3. Ultrasound scan to confirm fetal viability with history of habitual miscarriages  - US OB LESS THAN 14 WEEKS WITH OB TRANSVAGINAL; Future   4. Breast tenderness   5. Nausea without vomiting  Plan:   Refill Rx: progesterone, see orders  Reviewed red flag symptoms and when to call  Schedule dating and viability ultrasound; RTC x 3 weeks for Nurse intake; RTX x 7 weeks for NOB Physical with JML or sooner if needed.  Juliann Pares, Student[employer] 05/01/20 4:47 PM

## 2020-05-01 NOTE — Patient Instructions (Signed)
Common Medications Safe in Pregnancy  Acne:      Constipation:  Benzoyl Peroxide     Colace  Clindamycin      Dulcolax Suppository  Topica Erythromycin     Fibercon  Salicylic Acid      Metamucil         Miralax AVOID:        Senakot   Accutane    Cough:  Retin-A       Cough Drops  Tetracycline      Phenergan w/ Codeine if Rx  Minocycline      Robitussin (Plain & DM)  Antibiotics:     Crabs/Lice:  Ceclor       RID  Cephalosporins    AVOID:  E-Mycins      Kwell  Keflex  Macrobid/Macrodantin   Diarrhea:  Penicillin      Kao-Pectate  Zithromax      Imodium AD         PUSH FLUIDS AVOID:       Cipro     Fever:  Tetracycline      Tylenol (Regular or Extra  Minocycline       Strength)  Levaquin      Extra Strength-Do not          Exceed 8 tabs/24 hrs Caffeine:        <200mg/day (equiv. To 1 cup of coffee or  approx. 3 12 oz sodas)         Gas: Cold/Hayfever:       Gas-X  Benadryl      Mylicon  Claritin       Phazyme  **Claritin-D        Chlor-Trimeton    Headaches:  Dimetapp      ASA-Free Excedrin  Drixoral-Non-Drowsy     Cold Compress  Mucinex (Guaifenasin)     Tylenol (Regular or Extra  Sudafed/Sudafed-12 Hour     Strength)  **Sudafed PE Pseudoephedrine   Tylenol Cold & Sinus     Vicks Vapor Rub  Zyrtec  **AVOID if Problems With Blood Pressure         Heartburn: Avoid lying down for at least 1 hour after meals  Aciphex      Maalox     Rash:  Milk of Magnesia     Benadryl    Mylanta       1% Hydrocortisone Cream  Pepcid  Pepcid Complete   Sleep Aids:  Prevacid      Ambien   Prilosec       Benadryl  Rolaids       Chamomile Tea  Tums (Limit 4/day)     Unisom         Tylenol PM         Warm milk-add vanilla or  Hemorrhoids:       Sugar for taste  Anusol/Anusol H.C.  (RX: Analapram 2.5%)  Sugar Substitutes:  Hydrocortisone OTC     Ok in moderation  Preparation H      Tucks        Vaseline lotion applied to tissue with  wiping    Herpes:     Throat:  Acyclovir      Oragel  Famvir  Valtrex     Vaccines:         Flu Shot Leg Cramps:       *Gardasil  Benadryl      Hepatitis A         Hepatitis B Nasal Spray:         Pneumovax  Saline Nasal Spray     Polio Booster         Tetanus Nausea:       Tuberculosis test or PPD  Vitamin B6 25 mg TID   AVOID:    Dramamine      *Gardasil  Emetrol       Live Poliovirus  Ginger Root 250 mg QID    MMR (measles, mumps &  High Complex Carbs @ Bedtime    rebella)  Sea Bands-Accupressure    Varicella (Chickenpox)  Unisom 1/2 tab TID     *No known complications           If received before Pain:         Known pregnancy;   Darvocet       Resume series after  Lortab        Delivery  Percocet    Yeast:   Tramadol      Femstat  Tylenol 3      Gyne-lotrimin  Ultram       Monistat  Vicodin           MISC:         All Sunscreens           Hair Coloring/highlights          Insect Repellant's          (Including DEET)         Mystic Tans    https://www.cdc.gov/pregnancy/infections.html">  First Trimester of Pregnancy  The first trimester of pregnancy starts on the first day of your last menstrual period until the end of week 12. This is also called months 1 through 3 of pregnancy. Body changes during your first trimester Your body goes through many changes during pregnancy. The changes usually return to normal after your baby is born. Physical changes  You may gain or lose weight.  Your breasts may grow larger and hurt. The area around your nipples may get darker.  Dark spots or blotches may develop on your face.  You may have changes in your hair. Health changes  You may feel like you might vomit (nauseous), and you may vomit.  You may have heartburn.  You may have headaches.  You may have trouble pooping (constipation).  Your gums may bleed. Other changes  You may get tired easily.  You may pee (urinate) more often.  Your menstrual periods will  stop.  You may not feel hungry.  You may want to eat certain kinds of food.  You may have changes in your emotions from day to day.  You may have more dreams. Follow these instructions at home: Medicines  Take over-the-counter and prescription medicines only as told by your doctor. Some medicines are not safe during pregnancy.  Take a prenatal vitamin that contains at least 600 micrograms (mcg) of folic acid. Eating and drinking  Eat healthy meals that include: ? Fresh fruits and vegetables. ? Whole grains. ? Good sources of protein, such as meat, eggs, or tofu. ? Low-fat dairy products.  Avoid raw meat and unpasteurized juice, milk, and cheese.  If you feel like you may vomit, or you vomit: ? Eat 4 or 5 small meals a day instead of 3 large meals. ? Try eating a few soda crackers. ? Drink liquids between meals instead of during meals.  You may need to take these actions to prevent or treat trouble pooping: ? Drink enough fluids to keep your pee (urine) pale yellow. ?   Eat foods that are high in fiber. These include beans, whole grains, and fresh fruits and vegetables. ? Limit foods that are high in fat and sugar. These include fried or sweet foods. Activity  Exercise only as told by your doctor. Most people can do their usual exercise routine during pregnancy.  Stop exercising if you have cramps or pain in your lower belly (abdomen) or low back.  Do not exercise if it is too hot or too humid, or if you are in a place of great height (high altitude).  Avoid heavy lifting.  If you choose to, you may have sex unless your doctor tells you not to. Relieving pain and discomfort  Wear a good support bra if your breasts are sore.  Rest with your legs raised (elevated) if you have leg cramps or low back pain.  If you have bulging veins (varicose veins) in your legs: ? Wear support hose as told by your doctor. ? Raise your feet for 15 minutes, 3-4 times a day. ? Limit salt  in your food. Safety  Wear your seat belt at all times when you are in a car.  Talk with your doctor if someone is hurting you or yelling at you.  Talk with your doctor if you are feeling sad or have thoughts of hurting yourself. Lifestyle  Do not use hot tubs, steam rooms, or saunas.  Do not douche. Do not use tampons or scented sanitary pads.  Do not use herbal medicines, illegal drugs, or medicines that are not approved by your doctor. Do not drink alcohol.  Do not smoke or use any products that contain nicotine or tobacco. If you need help quitting, ask your doctor.  Avoid cat litter boxes and soil that is used by cats. These carry germs that can cause harm to the baby and can cause a loss of your baby by miscarriage or stillbirth. General instructions  Keep all follow-up visits. This is important.  Ask for help if you need counseling or if you need help with nutrition. Your doctor can give you advice or tell you where to go for help.  Visit your dentist. At home, brush your teeth with a soft toothbrush. Floss gently.  Write down your questions. Take them to your prenatal visits. Where to find more information  American Pregnancy Association: americanpregnancy.org  American College of Obstetricians and Gynecologists: www.acog.org  Office on Women's Health: womenshealth.gov/pregnancy Contact a doctor if:  You are dizzy.  You have a fever.  You have mild cramps or pressure in your lower belly.  You have a nagging pain in your belly area.  You continue to feel like you may vomit, you vomit, or you have watery poop (diarrhea) for 24 hours or longer.  You have a bad-smelling fluid coming from your vagina.  You have pain when you pee.  You are exposed to a disease that spreads from person to person, such as chickenpox, measles, Zika virus, HIV, or hepatitis. Get help right away if:  You have spotting or bleeding from your vagina.  You have very bad belly cramping  or pain.  You have shortness of breath or chest pain.  You have any kind of injury, such as from a fall or a car crash.  You have new or increased pain, swelling, or redness in an arm or leg. Summary  The first trimester of pregnancy starts on the first day of your last menstrual period until the end of week 12 (months 1 through   3).  Eat 4 or 5 small meals a day instead of 3 large meals.  Do not smoke or use any products that contain nicotine or tobacco. If you need help quitting, ask your doctor.  Keep all follow-up visits. This information is not intended to replace advice given to you by your health care provider. Make sure you discuss any questions you have with your health care provider. Document Revised: 06/23/2019 Document Reviewed: 04/29/2019 Elsevier Patient Education  2021 Elsevier Inc.  

## 2020-05-12 ENCOUNTER — Ambulatory Visit
Admission: RE | Admit: 2020-05-12 | Discharge: 2020-05-12 | Disposition: A | Discharge status: Home / Self Care | Payer: BC Managed Care – PPO | Source: Ambulatory Visit | Attending: Certified Nurse Midwife | Admitting: Certified Nurse Midwife

## 2020-05-12 ENCOUNTER — Other Ambulatory Visit: Payer: Self-pay

## 2020-05-12 DIAGNOSIS — N96 Recurrent pregnancy loss: Secondary | ICD-10-CM | POA: Insufficient documentation

## 2020-05-12 DIAGNOSIS — Z3A08 8 weeks gestation of pregnancy: Secondary | ICD-10-CM | POA: Diagnosis not present

## 2020-05-12 DIAGNOSIS — Z3201 Encounter for pregnancy test, result positive: Secondary | ICD-10-CM | POA: Diagnosis not present

## 2020-05-12 DIAGNOSIS — O3680X1 Pregnancy with inconclusive fetal viability, fetus 1: Secondary | ICD-10-CM | POA: Diagnosis not present

## 2020-05-12 DIAGNOSIS — O2620 Pregnancy care for patient with recurrent pregnancy loss, unspecified trimester: Secondary | ICD-10-CM | POA: Diagnosis not present

## 2020-05-24 ENCOUNTER — Ambulatory Visit: Payer: BC Managed Care – PPO

## 2020-05-26 ENCOUNTER — Ambulatory Visit (INDEPENDENT_AMBULATORY_CARE_PROVIDER_SITE_OTHER): Payer: BC Managed Care – PPO | Admitting: Surgical

## 2020-05-26 ENCOUNTER — Other Ambulatory Visit: Payer: Self-pay

## 2020-05-26 VITALS — BP 126/82 | HR 86 | Ht 62.0 in | Wt 138.9 lb

## 2020-05-26 DIAGNOSIS — Z3491 Encounter for supervision of normal pregnancy, unspecified, first trimester: Secondary | ICD-10-CM | POA: Diagnosis not present

## 2020-05-26 NOTE — Progress Notes (Signed)
Jenny Mcguire presents for NOB nurse interview visit. Pregnancy confirmation done _4/4/2022_. G3. P2002. Pregnancy education material explained and given. 0_ cats in home. NOB labs ordered. HIV labs and drug screen were explained and ordered. PNV encouraged. Genetic screening options discussed. Genetic testing: Ordered. Patient requested Materniti21 lab Patient may discuss with the provider. Patient to follow up with provider on 06/23/2020 for NOB physical. All questions answered. Financial form given to patient. Patient signed drug and FMLA forms.

## 2020-05-27 LAB — CBC WITH DIFFERENTIAL/PLATELET
Basophils Absolute: 0 10*3/uL (ref 0.0–0.2)
Basos: 1 %
EOS (ABSOLUTE): 0.1 10*3/uL (ref 0.0–0.4)
Eos: 1 %
Hematocrit: 38.4 % (ref 34.0–46.6)
Hemoglobin: 13.2 g/dL (ref 11.1–15.9)
Immature Grans (Abs): 0 10*3/uL (ref 0.0–0.1)
Immature Granulocytes: 1 %
Lymphocytes Absolute: 1.3 10*3/uL (ref 0.7–3.1)
Lymphs: 21 %
MCH: 32.3 pg (ref 26.6–33.0)
MCHC: 34.4 g/dL (ref 31.5–35.7)
MCV: 94 fL (ref 79–97)
Monocytes Absolute: 0.4 10*3/uL (ref 0.1–0.9)
Monocytes: 6 %
Neutrophils Absolute: 4.5 10*3/uL (ref 1.4–7.0)
Neutrophils: 70 %
Platelets: 236 10*3/uL (ref 150–450)
RBC: 4.09 x10E6/uL (ref 3.77–5.28)
RDW: 12.8 % (ref 11.7–15.4)
WBC: 6.3 10*3/uL (ref 3.4–10.8)

## 2020-05-27 LAB — URINALYSIS, ROUTINE W REFLEX MICROSCOPIC
Bilirubin, UA: NEGATIVE
Glucose, UA: NEGATIVE
Ketones, UA: NEGATIVE
Leukocytes,UA: NEGATIVE
Nitrite, UA: NEGATIVE
RBC, UA: NEGATIVE
Specific Gravity, UA: 1.02 (ref 1.005–1.030)
Urobilinogen, Ur: 0.2 mg/dL (ref 0.2–1.0)
pH, UA: 8 — ABNORMAL HIGH (ref 5.0–7.5)

## 2020-05-27 LAB — RUBELLA SCREEN: Rubella Antibodies, IGG: 2.15 index (ref >0.99)

## 2020-05-27 LAB — ABO AND RH: Rh Factor: POSITIVE

## 2020-05-27 LAB — VARICELLA ZOSTER ANTIBODY, IGG: Varicella zoster IgG: 356 index (ref >165)

## 2020-05-27 LAB — ANTIBODY SCREEN: Antibody Screen: NEGATIVE

## 2020-05-27 LAB — HIV ANTIBODY (ROUTINE TESTING W REFLEX): HIV Screen 4th Generation wRfx: NONREACTIVE

## 2020-05-27 LAB — RPR: RPR Ser Ql: NONREACTIVE

## 2020-05-28 LAB — GC/CHLAMYDIA PROBE AMP
Chlamydia trachomatis, NAA: NEGATIVE
Neisseria Gonorrhoeae by PCR: NEGATIVE

## 2020-05-28 LAB — CULTURE, OB URINE

## 2020-05-28 LAB — URINE CULTURE, OB REFLEX

## 2020-05-29 LAB — MONITOR DRUG PROFILE 14(MW)
Amphetamine Scrn, Ur: NEGATIVE ng/mL
BARBITURATE SCREEN URINE: NEGATIVE ng/mL
BENZODIAZEPINE SCREEN, URINE: NEGATIVE ng/mL
Buprenorphine, Urine: NEGATIVE ng/mL
CANNABINOIDS UR QL SCN: NEGATIVE ng/mL
Cocaine (Metab) Scrn, Ur: NEGATIVE ng/mL
Creatinine(Crt), U: 159.6 mg/dL (ref 20.0–300.0)
Fentanyl, Urine: NEGATIVE pg/mL
Meperidine Screen, Urine: NEGATIVE ng/mL
Methadone Screen, Urine: NEGATIVE ng/mL
OXYCODONE+OXYMORPHONE UR QL SCN: NEGATIVE ng/mL
Opiate Scrn, Ur: NEGATIVE ng/mL
Ph of Urine: 7.5 (ref 4.5–8.9)
Phencyclidine Qn, Ur: NEGATIVE ng/mL
Propoxyphene Scrn, Ur: NEGATIVE ng/mL
SPECIFIC GRAVITY: 1.021
Tramadol Screen, Urine: NEGATIVE ng/mL

## 2020-05-31 LAB — MATERNIT21  PLUS CORE+ESS+SCA, BLOOD

## 2020-06-23 ENCOUNTER — Encounter: Payer: Self-pay | Admitting: Certified Nurse Midwife

## 2020-06-23 ENCOUNTER — Other Ambulatory Visit: Payer: Self-pay

## 2020-06-23 ENCOUNTER — Ambulatory Visit (INDEPENDENT_AMBULATORY_CARE_PROVIDER_SITE_OTHER): Payer: BC Managed Care – PPO | Admitting: Certified Nurse Midwife

## 2020-06-23 VITALS — BP 115/79 | HR 102 | Wt 139.9 lb

## 2020-06-23 DIAGNOSIS — Z3A13 13 weeks gestation of pregnancy: Secondary | ICD-10-CM

## 2020-06-23 DIAGNOSIS — Z3689 Encounter for other specified antenatal screening: Secondary | ICD-10-CM

## 2020-06-23 DIAGNOSIS — Z3491 Encounter for supervision of normal pregnancy, unspecified, first trimester: Secondary | ICD-10-CM

## 2020-06-23 LAB — POCT URINALYSIS DIPSTICK OB
Bilirubin, UA: NEGATIVE
Blood, UA: NEGATIVE
Glucose, UA: NEGATIVE
Ketones, UA: NEGATIVE
Leukocytes, UA: NEGATIVE
Nitrite, UA: NEGATIVE
POC,PROTEIN,UA: NEGATIVE
Spec Grav, UA: 1.01 (ref 1.010–1.025)
Urobilinogen, UA: 0.2 E.U./dL
pH, UA: 6 (ref 5.0–8.0)

## 2020-06-23 NOTE — Progress Notes (Signed)
NEW OB HISTORY AND PHYSICAL  SUBJECTIVE:       Jenny Mcguire is a 35 y.o. G62P2002 female, Patient's last menstrual period was 03/20/2020., Estimated Date of Delivery: 12/25/20, [redacted]w[redacted]d, presents today for establishment of Prenatal Care.  Notes resolving nausea.   Denies difficulty breathing or respiratory distress, chest pain, abdominal pain, vaginal bleeding, dysuria, and leg pain or swelling.   Genetic screening low risk female-Jenny Mcguire.   Desires midwifery care.    Gynecologic History  Patient's last menstrual period was 03/20/2020.   Contraception: none  Last Pap: 01/2020. Results were: Neg/Neg.   Obstetric History  OB History  Gravida Para Term Preterm AB Living  3 2 2     2   SAB IAB Ectopic Multiple Live Births        0 2    # Outcome Date GA Lbr Len/2nd Weight Sex Delivery Anes PTL Lv  3 Current           2 Term 09/29/16 [redacted]w[redacted]d / 00:12 7 lb 0.2 oz (3.18 kg) F Vag-Spont EPI  LIV  1 Term 2011    F Vag-Spont  N LIV    Past Medical History:  Diagnosis Date  . Amenorrhea   . History of UTI     Past Surgical History:  Procedure Laterality Date  . BREAST ENHANCEMENT SURGERY      Current Outpatient Medications on File Prior to Visit  Medication Sig Dispense Refill  . aspirin EC 81 MG tablet Take 1 tablet (81 mg total) by mouth daily. 300 tablet 2  . prenatal vitamin w/FE, FA (PRENATAL 1 + 1) 27-1 MG TABS tablet Take 1 tablet by mouth daily at 12 noon.    . Probiotic Product (PROBIOTIC ADVANCED PO) Take by mouth.    . progesterone (PROMETRIUM) 200 MG capsule Place 2 capsules (400 mg total) vaginally daily. 60 capsule 2   No current facility-administered medications on file prior to visit.    Allergies  Allergen Reactions  . Sulfa Antibiotics     Social History   Socioeconomic History  . Marital status: Married    Spouse name: Not on file  . Number of children: Not on file  . Years of education: Not on file  . Highest education level: Not on file   Occupational History  . Not on file  Tobacco Use  . Smoking status: Never Smoker  . Smokeless tobacco: Never Used  Vaping Use  . Vaping Use: Never used  Substance and Sexual Activity  . Alcohol use: Not Currently    Comment: occas/ not during pregnancy  . Drug use: No  . Sexual activity: Yes  Other Topics Concern  . Not on file  Social History Narrative  . Not on file   Social Determinants of Health   Financial Resource Strain: Not on file  Food Insecurity: Not on file  Transportation Needs: Not on file  Physical Activity: Not on file  Stress: Not on file  Social Connections: Not on file  Intimate Partner Violence: Not on file    Family History  Problem Relation Age of Onset  . COPD Mother   . Cancer Neg Hx   . Heart failure Neg Hx   . Diabetes Neg Hx     The following portions of the patient's history were reviewed and updated as appropriate: allergies, current medications, past OB history, past medical history, past surgical history, past family history, past social history, and problem list.  Review of Systems:  ROS  negative except as noted above. Information obtained from patient.   OBJECTIVE:  BP 115/79   Pulse (!) 102   Wt 139 lb 14.4 oz (63.5 kg)   LMP 03/20/2020   BMI 25.59 kg/m   Initial Physical Exam (New OB)  GENERAL APPEARANCE: alert, well appearing, in no apparent distress  HEAD: normocephalic, atraumatic  MOUTH: deferred, COVID-19 pandemic  LUNGS: clear to auscultation, no wheezes, rales or rhonchi, symmetric air entry  HEART: regular rate and rhythm, no murmurs  ABDOMEN: soft, nontender, nondistended, no abnormal masses, no epigastric pain, fundus soft, nontender 12 weeks size and FHT present  EXTREMITIES: no redness or tenderness in the calves or thighs  SKIN: normal coloration and turgor, no rashes  NEUROLOGIC: alert, oriented, normal speech, no focal findings or movement disorder noted  PELVIC EXAM: deferred, no  complaints  ASSESSMENT:  Normal pregnancy  13 weeks of pregnancy  Genetic screening-low risk female  PLAN: Prenatal care New OB counseling: The patient has been given an overview regarding routine prenatal care. Recommendations regarding diet, weight gain, and exercise in pregnancy were given. Prenatal testing, optional genetic testing, and ultrasound use in pregnancy were reviewed.  Benefits of Breast Feeding were discussed. The patient is encouraged to consider nursing her baby post partum. See orders

## 2020-06-23 NOTE — Patient Instructions (Signed)
Healthy Weight Gain During Pregnancy A certain amount of weight gain during pregnancy is normal and healthy. The amount of weight you should gain during pregnancy depends on your overall health and your weight before you became pregnant. Talk with your health care provider to find out how much weight you should gain during your pregnancy. General guidelines for a healthy total weight gain during pregnancy are based on your body mass index (BMI) and are listed below. If your BMI at or before the start of your pregnancy is:  Less than 18.5 (underweight), you should gain 28-40 lb (13-18 kg).  18.5-24.9 (normal weight), you should gain 25-35 lb (11-16 kg).  25-29.9 (overweight), you should gain 15-25 lb (7-11 kg).  30 or higher (obese), you should gain 11-20 lb (5-9 kg). Your health care provider may recommend that you:  Gain more weight, if you were underweight before pregnancy or if you are pregnant with more than one baby.  Gain less weight, if you were overweight before pregnancy or if you are gaining too much weight during your pregnancy. How does unhealthy weight gain affect me? Gaining too much weight during pregnancy can lead to pregnancy complications, such as:  A temporary form of diabetes that develops during pregnancy (gestational diabetes).  Hypertensive disorders of pregnancy (preeclampsia or gestational hypertension).  Raising your risk of having a more difficult delivery or a surgical delivery (cesarean delivery, or C-section). How does unhealthy weight gain affect my baby? Not gaining enough weight can be life-threatening for your baby. It may also raise these risks for your baby:  Being born early (preterm).  Being smaller than normal during pregnancy or not growing normally (intrauterine growth restriction).  Having a low weight at birth. Gaining too much weight may raise these risks for your baby:  Growing larger than normal during pregnancy (large for gestational  age or macrosomia).  Increased risk of obesity. What actions can I take to gain a healthy amount of weight during pregnancy? Nutrition  Eat healthy foods. Every day, try to eat: ? Fruits and vegetables. Include a variety of colors and types, like sweet potatoes, oranges, apples, bell peppers, beets, berries, squash, and broccoli. ? Whole grains, such as millet, barley, whole-wheat breads and cereals, and oatmeal. ? Low-fat dairy products, like yogurt, milk, and cheese. You can also include non-dairy choices, like almond milk or rice milk. ? Protein-rich foods, like lean meat, chicken, eggs, peas, and beans.  Avoid foods that are fried or have a lot of fat, salt (sodium), or sugar.  Drink enough fluid to keep your urine pale yellow.  Choose healthy snack and drink options when you are away from home: ? Drink water. Avoid soda, sports drinks, and juices that have added sugar. ? Avoid drinks with caffeine, such as coffee and energy drinks. ? Eat snacks that are high in protein, such as nuts, protein bars, and low-fat yogurt. ? Carry convenient snacks with you that do not need refrigeration, such as a pack of trail mix, an apple, or a granola bar.  If you need help improving your diet, work with a health care provider or a dietitian.   Activity  Exercise regularly, as told by your health care provider. ? If you were active before becoming pregnant, you may be able to continue your regular fitness activities. ? If you were not active before pregnancy, you may gradually build up to exercising for 30 or more minutes on most days of the week. This may include walking, swimming,   or yoga.  Ask your health care provider what activities are safe for you. Talk with your health care provider about whether you may need to be excused from certain school or work activities.   Follow these instructions at home:  Take over-the-counter and prescription medicines only as told by your health care  provider.  Take all prenatal supplements as told by your health care provider.  Keep track of your weight gain during pregnancy.  Keep all health care visits during pregnancy (prenatal visits). These visits are a good time to discuss your weight gain. Your health care provider will weigh you at each visit to make sure you are gaining a healthy amount of weight. Where to find support Some pregnant women and teens face unique challenges and need extra support. If you have questions or need help gaining a healthy amount of weight during pregnancy, these people may help:  Your health care provider.  A dietitian.  Your school nurse.  Your family and friends.  Local counseling centers, church groups, or clinics that have services for teens. Where to find more information Learn more about managing your weight gain during pregnancy from:  American Pregnancy Association: americanpregnancy.org  [place] Department of Agriculture pregnancy weight gain calculator: myplate.gov Contact a health care provider if:  You are unable to eat or drink for longer than 24 hours.  You cannot afford food or have trouble accessing regular meals. Get help right away if you: Summary  Talk with your health care provider to find out how much weight you should gain during your pregnancy.  Too much or too little weight gain during pregnancy can lead to complications for you and your baby.  Eat healthy foods like fruits and vegetables, whole grains, low-fat dairy products, and protein-rich foods.  Ask your health care provider what activities are safe for you.  Keep all of your prenatal visits. This information is not intended to replace advice given to you by your health care provider. Make sure you discuss any questions you have with your health care provider. Document Revised: 08/12/2019 Document Reviewed: 08/12/2019 Elsevier Patient Education  2021 Elsevier Inc.    Common Medications Safe in  Pregnancy  Acne:      Constipation:  Benzoyl Peroxide     Colace  Clindamycin      Dulcolax Suppository  Topica Erythromycin     Fibercon  Salicylic Acid      Metamucil         Miralax AVOID:        Senakot   Accutane    Cough:  Retin-A       Cough Drops  Tetracycline      Phenergan w/ Codeine if Rx  Minocycline      Robitussin (Plain & DM)  Antibiotics:     Crabs/Lice:  Ceclor       RID  Cephalosporins    AVOID:  E-Mycins      Kwell  Keflex  Macrobid/Macrodantin   Diarrhea:  Penicillin      Kao-Pectate  Zithromax      Imodium AD         PUSH FLUIDS AVOID:       Cipro     Fever:  Tetracycline      Tylenol (Regular or Extra  Minocycline       Strength)  Levaquin      Extra Strength-Do not          Exceed 8 tabs/24 hrs Caffeine:        <  239m/day (equiv. To 1 cup of coffee or  approx. 3 12 oz sodas)         Gas: Cold/Hayfever:       Gas-X  Benadryl      Mylicon  Claritin       Phazyme  **Claritin-D        Chlor-Trimeton    Headaches:  Dimetapp      ASA-Free Excedrin  Drixoral-Non-Drowsy     Cold Compress  Mucinex (Guaifenasin)     Tylenol (Regular or Extra  Sudafed/Sudafed-12 Hour     Strength)  **Sudafed PE Pseudoephedrine   Tylenol Cold & Sinus     Vicks Vapor Rub  Zyrtec  **AVOID if Problems With Blood Pressure         Heartburn: Avoid lying down for at least 1 hour after meals  Aciphex      Maalox     Rash:  Milk of Magnesia     Benadryl    Mylanta       1% Hydrocortisone Cream  Pepcid  Pepcid Complete   Sleep Aids:  Prevacid      Ambien   Prilosec       Benadryl  Rolaids       Chamomile Tea  Tums (Limit 4/day)     Unisom         Tylenol PM         Warm milk-add vanilla or  Hemorrhoids:       Sugar for taste  Anusol/Anusol H.C.  (RX: Analapram 2.5%)  Sugar Substitutes:  Hydrocortisone OTC     Ok in moderation  Preparation H      Tucks        Vaseline lotion applied to tissue with  wiping    Herpes:     Throat:  Acyclovir      Oragel  Famvir  Valtrex     Vaccines:         Flu Shot Leg Cramps:       *Gardasil  Benadryl      Hepatitis A         Hepatitis B Nasal Spray:       Pneumovax  Saline Nasal Spray     Polio Booster         Tetanus Nausea:       Tuberculosis test or PPD  Vitamin B6 25 mg TID   AVOID:    Dramamine      *Gardasil  Emetrol       Live Poliovirus  Ginger Root 250 mg QID    MMR (measles, mumps &  High Complex Carbs @ Bedtime    rebella)  Sea Bands-Accupressure    Varicella (Chickenpox)  Unisom 1/2 tab TID     *No known complications           If received before Pain:         Known pregnancy;   Darvocet       Resume series after  Lortab        Delivery  Percocet    Yeast:   Tramadol      Femstat  Tylenol 3      Gyne-lotrimin  Ultram       Monistat  Vicodin           MISC:         All Sunscreens           Hair Coloring/highlights          Insect  Repellant's          (Including DEET)         Mystic Tans   Second Trimester of Pregnancy  The second trimester of pregnancy is from week 13 through week 27. This is also called months 4 through 6 of pregnancy. This is often the time when you feel your best. During the second trimester:  Morning sickness is less or has stopped.  You may have more energy.  You may feel hungry more often. At this time, your unborn baby (fetus) is growing very fast. At the end of the sixth month, the unborn baby may be up to 12 inches long and weigh about 1 pounds. You will likely start to feel the baby move between 16 and 20 weeks of pregnancy. Body changes during your second trimester Your body continues to go through many changes during this time. The changes vary and generally return to normal after the baby is born. Physical changes  You will gain more weight.  You may start to get stretch marks on your hips, belly (abdomen), and breasts.  Your breasts will grow and may hurt.  Dark spots or  blotches may develop on your face.  A dark line from your belly button to the pubic area (linea nigra) may appear.  You may have changes in your hair. Health changes  You may have headaches.  You may have heartburn.  You may have trouble pooping (constipation).  You may have hemorrhoids or swollen, bulging veins (varicose veins).  Your gums may bleed.  You may pee (urinate) more often.  You may have back pain. Follow these instructions at home: Medicines  Take over-the-counter and prescription medicines only as told by your doctor. Some medicines are not safe during pregnancy.  Take a prenatal vitamin that contains at least 600 micrograms (mcg) of folic acid. Eating and drinking  Eat healthy meals that include: ? Fresh fruits and vegetables. ? Whole grains. ? Good sources of protein, such as meat, eggs, or tofu. ? Low-fat dairy products.  Avoid raw meat and unpasteurized juice, milk, and cheese.  You may need to take these actions to prevent or treat trouble pooping: ? Drink enough fluids to keep your pee (urine) pale yellow. ? Eat foods that are high in fiber. These include beans, whole grains, and fresh fruits and vegetables. ? Limit foods that are high in fat and sugar. These include fried or sweet foods. Activity  Exercise only as told by your doctor. Most people can do their usual exercise during pregnancy. Try to exercise for 30 minutes at least 5 days a week.  Stop exercising if you have pain or cramps in your belly or lower back.  Do not exercise if it is too hot or too humid, or if you are in a place of great height (high altitude).  Avoid heavy lifting.  If you choose to, you may have sex unless your doctor tells you not to. Relieving pain and discomfort  Wear a good support bra if your breasts are sore.  Take warm water baths (sitz baths) to soothe pain or discomfort caused by hemorrhoids. Use hemorrhoid cream if your doctor approves.  Rest with  your legs raised (elevated) if you have leg cramps or low back pain.  If you develop bulging veins in your legs: ? Wear support hose as told by your doctor. ? Raise your feet for 15 minutes, 3-4 times a day. ? Limit salt in your food. Safety    Wear your seat belt at all times when you are in a car.  Talk with your doctor if someone is hurting you or yelling at you a lot. Lifestyle  Do not use hot tubs, steam rooms, or saunas.  Do not douche. Do not use tampons or scented sanitary pads.  Avoid cat litter boxes and soil used by cats. These carry germs that can harm your baby and can cause a loss of your baby by miscarriage or stillbirth.  Do not use herbal medicines, illegal drugs, or medicines that are not approved by your doctor. Do not drink alcohol.  Do not smoke or use any products that contain nicotine or tobacco. If you need help quitting, ask your doctor. General instructions  Keep all follow-up visits. This is important.  Ask your doctor about local prenatal classes.  Ask your doctor about the right foods to eat or for help finding a counselor. Where to find more information  American Pregnancy Association: americanpregnancy.org  American College of Obstetricians and Gynecologists: www.acog.org  Office on Women's Health: womenshealth.gov/pregnancy Contact a doctor if:  You have a headache that does not go away when you take medicine.  You have changes in how you see, or you see spots in front of your eyes.  You have mild cramps, pressure, or pain in your lower belly.  You continue to feel like you may vomit (nauseous), you vomit, or you have watery poop (diarrhea).  You have bad-smelling fluid coming from your vagina.  You have pain when you pee or your pee smells bad.  You have very bad swelling of your face, hands, ankles, feet, or legs.  You have a fever. Get help right away if:  You are leaking fluid from your vagina.  You have spotting or bleeding  from your vagina.  You have very bad belly cramping or pain.  You have trouble breathing.  You have chest pain.  You faint.  You have not felt your baby move for the time period told by your doctor.  You have new or increased pain, swelling, or redness in an arm or leg. Summary  The second trimester of pregnancy is from week 13 through week 27 (months 4 through 6).  Eat healthy meals.  Exercise as told by your doctor. Most people can do their usual exercise during pregnancy.  Do not use herbal medicines, illegal drugs, or medicines that are not approved by your doctor. Do not drink alcohol.  Call your doctor if you get sick or if you notice anything unusual about your pregnancy. This information is not intended to replace advice given to you by your health care provider. Make sure you discuss any questions you have with your health care provider. Document Revised: 06/23/2019 Document Reviewed: 04/29/2019 Elsevier Patient Education  2021 Elsevier Inc.  

## 2020-06-23 NOTE — Progress Notes (Signed)
NOB physical: Doing well. No concerns.

## 2020-07-19 ENCOUNTER — Other Ambulatory Visit: Payer: Self-pay

## 2020-07-19 ENCOUNTER — Ambulatory Visit
Admission: RE | Admit: 2020-07-19 | Discharge: 2020-07-19 | Disposition: A | Discharge status: Home / Self Care | Payer: BC Managed Care – PPO | Source: Ambulatory Visit | Attending: Certified Nurse Midwife | Admitting: Certified Nurse Midwife

## 2020-07-19 DIAGNOSIS — Z3A17 17 weeks gestation of pregnancy: Secondary | ICD-10-CM | POA: Diagnosis not present

## 2020-07-19 DIAGNOSIS — Z3491 Encounter for supervision of normal pregnancy, unspecified, first trimester: Secondary | ICD-10-CM

## 2020-07-19 DIAGNOSIS — Z3A13 13 weeks gestation of pregnancy: Secondary | ICD-10-CM | POA: Diagnosis not present

## 2020-07-19 DIAGNOSIS — Z3492 Encounter for supervision of normal pregnancy, unspecified, second trimester: Secondary | ICD-10-CM | POA: Diagnosis not present

## 2020-07-19 DIAGNOSIS — Z3689 Encounter for other specified antenatal screening: Secondary | ICD-10-CM | POA: Diagnosis not present

## 2020-07-21 ENCOUNTER — Encounter: Payer: Self-pay | Admitting: Certified Nurse Midwife

## 2020-07-21 ENCOUNTER — Other Ambulatory Visit: Payer: Self-pay

## 2020-07-21 ENCOUNTER — Ambulatory Visit (INDEPENDENT_AMBULATORY_CARE_PROVIDER_SITE_OTHER): Payer: BC Managed Care – PPO | Admitting: Certified Nurse Midwife

## 2020-07-21 VITALS — BP 116/76 | HR 91 | Wt 142.6 lb

## 2020-07-21 DIAGNOSIS — Z3A17 17 weeks gestation of pregnancy: Secondary | ICD-10-CM

## 2020-07-21 DIAGNOSIS — Z3689 Encounter for other specified antenatal screening: Secondary | ICD-10-CM

## 2020-07-21 DIAGNOSIS — Z3482 Encounter for supervision of other normal pregnancy, second trimester: Secondary | ICD-10-CM

## 2020-07-21 LAB — POCT URINALYSIS DIPSTICK OB
Glucose, UA: NEGATIVE
POC,PROTEIN,UA: NEGATIVE
Spec Grav, UA: 1.01 (ref 1.010–1.025)
pH, UA: 8 (ref 5.0–8.0)

## 2020-07-21 MED ORDER — PROGESTERONE 200 MG PO CAPS: 400.0000 mg | ORAL_CAPSULE | Freq: Every day | ORAL | 2 refills | Status: DC

## 2020-07-21 NOTE — Progress Notes (Signed)
ROB-Doing well. Patient wishes to continue vaginal progesterone; Rx sent, see orders. ANATOMY SCAN incomplete-heart and kidney views needed; follow ultrasound order placed, see chart. Anticipatory guidance regarding course of prenatal care. Reviewed red flag symptoms and when to call. RTC x 3-4 weeks for ROB or sooner if needed.

## 2020-07-21 NOTE — Progress Notes (Signed)
ROB: She is doing well, she has no complaints.

## 2020-07-21 NOTE — Patient Instructions (Signed)
Second Trimester of Pregnancy °The second trimester of pregnancy is from week 13 through week 27. This is also called months 4 through 6 of pregnancy. This is often the time when you feel your best. °During the second trimester: °Morning sickness is less or has stopped. °You may have more energy. °You may feel hungry more often. °At this time, your unborn baby (fetus) is growing very fast. At the end of the sixth month, the unborn baby may be up to 12 inches long and weigh about 1½ pounds. You will likely start to feel the baby move between 16 and 20 weeks of pregnancy. °Body changes during your second trimester °Your body continues to go through many changes during this time. The changes vary and generally return to normal after the baby is born. °Physical changes °You will gain more weight. °You may start to get stretch marks on your hips, belly (abdomen), and breasts. °Your breasts will grow and may hurt. °Dark spots or blotches may develop on your face. °A dark line from your belly button to the pubic area (linea nigra) may appear. °You may have changes in your hair. °Health changes °You may have headaches. °You may have heartburn. °You may have trouble pooping (constipation). °You may have hemorrhoids or swollen, bulging veins (varicose veins). °Your gums may bleed. °You may pee (urinate) more often. °You may have back pain. °Follow these instructions at home: °Medicines °Take over-the-counter and prescription medicines only as told by your doctor. Some medicines are not safe during pregnancy. °Take a prenatal vitamin that contains at least 600 micrograms (mcg) of folic acid. °Eating and drinking °Eat healthy meals that include: °Fresh fruits and vegetables. °Whole grains. °Good sources of protein, such as meat, eggs, or tofu. °Low-fat dairy products. °Avoid raw meat and unpasteurized juice, milk, and cheese. °You may need to take these actions to prevent or treat trouble pooping: °Drink enough fluids to keep  your pee (urine) pale yellow. °Eat foods that are high in fiber. These include beans, whole grains, and fresh fruits and vegetables. °Limit foods that are high in fat and sugar. These include fried or sweet foods. °Activity °Exercise only as told by your doctor. Most people can do their usual exercise during pregnancy. Try to exercise for 30 minutes at least 5 days a week. °Stop exercising if you have pain or cramps in your belly or lower back. °Do not exercise if it is too hot or too humid, or if you are in a place of great height (high altitude). °Avoid heavy lifting. °If you choose to, you may have sex unless your doctor tells you not to. °Relieving pain and discomfort °Wear a good support bra if your breasts are sore. °Take warm water baths (sitz baths) to soothe pain or discomfort caused by hemorrhoids. Use hemorrhoid cream if your doctor approves. °Rest with your legs raised (elevated) if you have leg cramps or low back pain. °If you develop bulging veins in your legs: °Wear support hose as told by your doctor. °Raise your feet for 15 minutes, 3-4 times a day. °Limit salt in your food. °Safety °Wear your seat belt at all times when you are in a car. °Talk with your doctor if someone is hurting you or yelling at you a lot. °Lifestyle °Do not use hot tubs, steam rooms, or saunas. °Do not douche. Do not use tampons or scented sanitary pads. °Avoid cat litter boxes and soil used by cats. These carry germs that can harm your baby and   can cause a loss of your baby by miscarriage or stillbirth. °Do not use herbal medicines, illegal drugs, or medicines that are not approved by your doctor. Do not drink alcohol. °Do not smoke or use any products that contain nicotine or tobacco. If you need help quitting, ask your doctor. °General instructions °Keep all follow-up visits. This is important. °Ask your doctor about local prenatal classes. °Ask your doctor about the right foods to eat or for help finding a  counselor. °Where to find more information °American Pregnancy Association: americanpregnancy.org °American College of Obstetricians and Gynecologists: www.acog.org °Office on Women's Health: womenshealth.gov/pregnancy °Contact a doctor if: °You have a headache that does not go away when you take medicine. °You have changes in how you see, or you see spots in front of your eyes. °You have mild cramps, pressure, or pain in your lower belly. °You continue to feel like you may vomit (nauseous), you vomit, or you have watery poop (diarrhea). °You have bad-smelling fluid coming from your vagina. °You have pain when you pee or your pee smells bad. °You have very bad swelling of your face, hands, ankles, feet, or legs. °You have a fever. °Get help right away if: °You are leaking fluid from your vagina. °You have spotting or bleeding from your vagina. °You have very bad belly cramping or pain. °You have trouble breathing. °You have chest pain. °You faint. °You have not felt your baby move for the time period told by your doctor. °You have new or increased pain, swelling, or redness in an arm or leg. °Summary °The second trimester of pregnancy is from week 13 through week 27 (months 4 through 6). °Eat healthy meals. °Exercise as told by your doctor. Most people can do their usual exercise during pregnancy. °Do not use herbal medicines, illegal drugs, or medicines that are not approved by your doctor. Do not drink alcohol. °Call your doctor if you get sick or if you notice anything unusual about your pregnancy. °This information is not intended to replace advice given to you by your health care provider. Make sure you discuss any questions you have with your health care provider. °Document Revised: 06/23/2019 Document Reviewed: 04/29/2019 °Elsevier Patient Education © 2022 Elsevier Inc. ° °

## 2020-08-11 ENCOUNTER — Other Ambulatory Visit: Payer: Self-pay

## 2020-08-11 ENCOUNTER — Encounter: Payer: Self-pay | Admitting: Certified Nurse Midwife

## 2020-08-11 ENCOUNTER — Ambulatory Visit (INDEPENDENT_AMBULATORY_CARE_PROVIDER_SITE_OTHER): Payer: BC Managed Care – PPO | Admitting: Certified Nurse Midwife

## 2020-08-11 VITALS — BP 107/73 | HR 89 | Wt 146.2 lb

## 2020-08-11 DIAGNOSIS — Z3A20 20 weeks gestation of pregnancy: Secondary | ICD-10-CM

## 2020-08-11 DIAGNOSIS — Z3402 Encounter for supervision of normal first pregnancy, second trimester: Secondary | ICD-10-CM

## 2020-08-11 LAB — POCT URINALYSIS DIPSTICK OB
Bilirubin, UA: NEGATIVE
Blood, UA: NEGATIVE
Glucose, UA: NEGATIVE
Ketones, UA: NEGATIVE
Leukocytes, UA: NEGATIVE
POC,PROTEIN,UA: NEGATIVE
Spec Grav, UA: 1.01 (ref 1.010–1.025)
Urobilinogen, UA: 0.2 E.U./dL
pH, UA: 6.5 (ref 5.0–8.0)

## 2020-08-11 NOTE — Progress Notes (Signed)
ROB: Feeling good today, no new concerns.

## 2020-08-11 NOTE — Progress Notes (Signed)
ROB-Doing well, no questions or concerns. Follow up ANATOMY SCAN scheduled 08/21/2020 at 1300. Anticipatory guidance regarding course of prenatal care. Reviewed red flag symptoms and when to call. RTC x 4 weeks for ROB or sooner if needed.

## 2020-08-11 NOTE — Patient Instructions (Signed)
Second Trimester of Pregnancy °The second trimester of pregnancy is from week 13 through week 27. This is also called months 4 through 6 of pregnancy. This is often the time when you feel your best. °During the second trimester: °Morning sickness is less or has stopped. °You may have more energy. °You may feel hungry more often. °At this time, your unborn baby (fetus) is growing very fast. At the end of the sixth month, the unborn baby may be up to 12 inches long and weigh about 1½ pounds. You will likely start to feel the baby move between 16 and 20 weeks of pregnancy. °Body changes during your second trimester °Your body continues to go through many changes during this time. The changes vary and generally return to normal after the baby is born. °Physical changes °You will gain more weight. °You may start to get stretch marks on your hips, belly (abdomen), and breasts. °Your breasts will grow and may hurt. °Dark spots or blotches may develop on your face. °A dark line from your belly button to the pubic area (linea nigra) may appear. °You may have changes in your hair. °Health changes °You may have headaches. °You may have heartburn. °You may have trouble pooping (constipation). °You may have hemorrhoids or swollen, bulging veins (varicose veins). °Your gums may bleed. °You may pee (urinate) more often. °You may have back pain. °Follow these instructions at home: °Medicines °Take over-the-counter and prescription medicines only as told by your doctor. Some medicines are not safe during pregnancy. °Take a prenatal vitamin that contains at least 600 micrograms (mcg) of folic acid. °Eating and drinking °Eat healthy meals that include: °Fresh fruits and vegetables. °Whole grains. °Good sources of protein, such as meat, eggs, or tofu. °Low-fat dairy products. °Avoid raw meat and unpasteurized juice, milk, and cheese. °You may need to take these actions to prevent or treat trouble pooping: °Drink enough fluids to keep  your pee (urine) pale yellow. °Eat foods that are high in fiber. These include beans, whole grains, and fresh fruits and vegetables. °Limit foods that are high in fat and sugar. These include fried or sweet foods. °Activity °Exercise only as told by your doctor. Most people can do their usual exercise during pregnancy. Try to exercise for 30 minutes at least 5 days a week. °Stop exercising if you have pain or cramps in your belly or lower back. °Do not exercise if it is too hot or too humid, or if you are in a place of great height (high altitude). °Avoid heavy lifting. °If you choose to, you may have sex unless your doctor tells you not to. °Relieving pain and discomfort °Wear a good support bra if your breasts are sore. °Take warm water baths (sitz baths) to soothe pain or discomfort caused by hemorrhoids. Use hemorrhoid cream if your doctor approves. °Rest with your legs raised (elevated) if you have leg cramps or low back pain. °If you develop bulging veins in your legs: °Wear support hose as told by your doctor. °Raise your feet for 15 minutes, 3-4 times a day. °Limit salt in your food. °Safety °Wear your seat belt at all times when you are in a car. °Talk with your doctor if someone is hurting you or yelling at you a lot. °Lifestyle °Do not use hot tubs, steam rooms, or saunas. °Do not douche. Do not use tampons or scented sanitary pads. °Avoid cat litter boxes and soil used by cats. These carry germs that can harm your baby and   can cause a loss of your baby by miscarriage or stillbirth. °Do not use herbal medicines, illegal drugs, or medicines that are not approved by your doctor. Do not drink alcohol. °Do not smoke or use any products that contain nicotine or tobacco. If you need help quitting, ask your doctor. °General instructions °Keep all follow-up visits. This is important. °Ask your doctor about local prenatal classes. °Ask your doctor about the right foods to eat or for help finding a  counselor. °Where to find more information °American Pregnancy Association: americanpregnancy.org °American College of Obstetricians and Gynecologists: www.acog.org °Office on Women's Health: womenshealth.gov/pregnancy °Contact a doctor if: °You have a headache that does not go away when you take medicine. °You have changes in how you see, or you see spots in front of your eyes. °You have mild cramps, pressure, or pain in your lower belly. °You continue to feel like you may vomit (nauseous), you vomit, or you have watery poop (diarrhea). °You have bad-smelling fluid coming from your vagina. °You have pain when you pee or your pee smells bad. °You have very bad swelling of your face, hands, ankles, feet, or legs. °You have a fever. °Get help right away if: °You are leaking fluid from your vagina. °You have spotting or bleeding from your vagina. °You have very bad belly cramping or pain. °You have trouble breathing. °You have chest pain. °You faint. °You have not felt your baby move for the time period told by your doctor. °You have new or increased pain, swelling, or redness in an arm or leg. °Summary °The second trimester of pregnancy is from week 13 through week 27 (months 4 through 6). °Eat healthy meals. °Exercise as told by your doctor. Most people can do their usual exercise during pregnancy. °Do not use herbal medicines, illegal drugs, or medicines that are not approved by your doctor. Do not drink alcohol. °Call your doctor if you get sick or if you notice anything unusual about your pregnancy. °This information is not intended to replace advice given to you by your health care provider. Make sure you discuss any questions you have with your health care provider. °Document Revised: 06/23/2019 Document Reviewed: 04/29/2019 °Elsevier Patient Education © 2022 Elsevier Inc. ° °

## 2020-08-21 ENCOUNTER — Ambulatory Visit
Admission: RE | Admit: 2020-08-21 | Discharge: 2020-08-21 | Disposition: A | Discharge status: Home / Self Care | Payer: BC Managed Care – PPO | Source: Ambulatory Visit | Attending: Certified Nurse Midwife | Admitting: Certified Nurse Midwife

## 2020-08-21 ENCOUNTER — Other Ambulatory Visit: Payer: Self-pay

## 2020-08-21 DIAGNOSIS — Z3A22 22 weeks gestation of pregnancy: Secondary | ICD-10-CM | POA: Insufficient documentation

## 2020-08-21 DIAGNOSIS — O321XX0 Maternal care for breech presentation, not applicable or unspecified: Secondary | ICD-10-CM | POA: Insufficient documentation

## 2020-08-21 DIAGNOSIS — Z3482 Encounter for supervision of other normal pregnancy, second trimester: Secondary | ICD-10-CM

## 2020-08-21 DIAGNOSIS — Z3A17 17 weeks gestation of pregnancy: Secondary | ICD-10-CM

## 2020-08-21 DIAGNOSIS — Z3689 Encounter for other specified antenatal screening: Secondary | ICD-10-CM | POA: Diagnosis not present

## 2020-09-15 ENCOUNTER — Ambulatory Visit (INDEPENDENT_AMBULATORY_CARE_PROVIDER_SITE_OTHER): Payer: BC Managed Care – PPO | Admitting: Obstetrics and Gynecology

## 2020-09-15 ENCOUNTER — Encounter: Payer: Self-pay | Admitting: Obstetrics and Gynecology

## 2020-09-15 ENCOUNTER — Other Ambulatory Visit: Payer: Self-pay

## 2020-09-15 VITALS — BP 120/80 | HR 108 | Wt 155.6 lb

## 2020-09-15 DIAGNOSIS — Z3402 Encounter for supervision of normal first pregnancy, second trimester: Secondary | ICD-10-CM

## 2020-09-15 DIAGNOSIS — Z3A25 25 weeks gestation of pregnancy: Secondary | ICD-10-CM

## 2020-09-15 LAB — POCT URINALYSIS DIPSTICK OB
Bilirubin, UA: NEGATIVE
Blood, UA: NEGATIVE
Glucose, UA: NEGATIVE
Ketones, UA: NEGATIVE
Leukocytes, UA: NEGATIVE
POC,PROTEIN,UA: NEGATIVE
Spec Grav, UA: 1.01 (ref 1.010–1.025)
pH, UA: 6.5 (ref 5.0–8.0)

## 2020-09-15 NOTE — Progress Notes (Signed)
ROB: Doing well, no complaints. Discussed breastfeeding, discussed pain management in labor (will likely go natural, had epidural in 2nd pregnancy). Plans to follow Serafina Royals, CNM to [facility] but is continuing care at [facility] until then.  RTC in 4 weeks, for glucola at that time.

## 2020-09-15 NOTE — Progress Notes (Signed)
ROB: She feels good, a little tired. Has no new concerns today.

## 2020-09-15 NOTE — Patient Instructions (Signed)
Second Trimester of Pregnancy °The second trimester of pregnancy is from week 13 through week 27. This is also called months 4 through 6 of pregnancy. This is often the time when you feel your best. °During the second trimester: °Morning sickness is less or has stopped. °You may have more energy. °You may feel hungry more often. °At this time, your unborn baby (fetus) is growing very fast. At the end of the sixth month, the unborn baby may be up to 12 inches long and weigh about 1½ pounds. You will likely start to feel the baby move between 16 and 20 weeks of pregnancy. °Body changes during your second trimester °Your body continues to go through many changes during this time. The changes vary and generally return to normal after the baby is born. °Physical changes °You will gain more weight. °You may start to get stretch marks on your hips, belly (abdomen), and breasts. °Your breasts will grow and may hurt. °Dark spots or blotches may develop on your face. °A dark line from your belly button to the pubic area (linea nigra) may appear. °You may have changes in your hair. °Health changes °You may have headaches. °You may have heartburn. °You may have trouble pooping (constipation). °You may have hemorrhoids or swollen, bulging veins (varicose veins). °Your gums may bleed. °You may pee (urinate) more often. °You may have back pain. °Follow these instructions at home: °Medicines °Take over-the-counter and prescription medicines only as told by your doctor. Some medicines are not safe during pregnancy. °Take a prenatal vitamin that contains at least 600 micrograms (mcg) of folic acid. °Eating and drinking °Eat healthy meals that include: °Fresh fruits and vegetables. °Whole grains. °Good sources of protein, such as meat, eggs, or tofu. °Low-fat dairy products. °Avoid raw meat and unpasteurized juice, milk, and cheese. °You may need to take these actions to prevent or treat trouble pooping: °Drink enough fluids to keep  your pee (urine) pale yellow. °Eat foods that are high in fiber. These include beans, whole grains, and fresh fruits and vegetables. °Limit foods that are high in fat and sugar. These include fried or sweet foods. °Activity °Exercise only as told by your doctor. Most people can do their usual exercise during pregnancy. Try to exercise for 30 minutes at least 5 days a week. °Stop exercising if you have pain or cramps in your belly or lower back. °Do not exercise if it is too hot or too humid, or if you are in a place of great height (high altitude). °Avoid heavy lifting. °If you choose to, you may have sex unless your doctor tells you not to. °Relieving pain and discomfort °Wear a good support bra if your breasts are sore. °Take warm water baths (sitz baths) to soothe pain or discomfort caused by hemorrhoids. Use hemorrhoid cream if your doctor approves. °Rest with your legs raised (elevated) if you have leg cramps or low back pain. °If you develop bulging veins in your legs: °Wear support hose as told by your doctor. °Raise your feet for 15 minutes, 3-4 times a day. °Limit salt in your food. °Safety °Wear your seat belt at all times when you are in a car. °Talk with your doctor if someone is hurting you or yelling at you a lot. °Lifestyle °Do not use hot tubs, steam rooms, or saunas. °Do not douche. Do not use tampons or scented sanitary pads. °Avoid cat litter boxes and soil used by cats. These carry germs that can harm your baby and   can cause a loss of your baby by miscarriage or stillbirth. °Do not use herbal medicines, illegal drugs, or medicines that are not approved by your doctor. Do not drink alcohol. °Do not smoke or use any products that contain nicotine or tobacco. If you need help quitting, ask your doctor. °General instructions °Keep all follow-up visits. This is important. °Ask your doctor about local prenatal classes. °Ask your doctor about the right foods to eat or for help finding a  counselor. °Where to find more information °American Pregnancy Association: americanpregnancy.org °American College of Obstetricians and Gynecologists: www.acog.org °Office on Women's Health: womenshealth.gov/pregnancy °Contact a doctor if: °You have a headache that does not go away when you take medicine. °You have changes in how you see, or you see spots in front of your eyes. °You have mild cramps, pressure, or pain in your lower belly. °You continue to feel like you may vomit (nauseous), you vomit, or you have watery poop (diarrhea). °You have bad-smelling fluid coming from your vagina. °You have pain when you pee or your pee smells bad. °You have very bad swelling of your face, hands, ankles, feet, or legs. °You have a fever. °Get help right away if: °You are leaking fluid from your vagina. °You have spotting or bleeding from your vagina. °You have very bad belly cramping or pain. °You have trouble breathing. °You have chest pain. °You faint. °You have not felt your baby move for the time period told by your doctor. °You have new or increased pain, swelling, or redness in an arm or leg. °Summary °The second trimester of pregnancy is from week 13 through week 27 (months 4 through 6). °Eat healthy meals. °Exercise as told by your doctor. Most people can do their usual exercise during pregnancy. °Do not use herbal medicines, illegal drugs, or medicines that are not approved by your doctor. Do not drink alcohol. °Call your doctor if you get sick or if you notice anything unusual about your pregnancy. °This information is not intended to replace advice given to you by your health care provider. Make sure you discuss any questions you have with your health care provider. °Document Revised: 06/23/2019 Document Reviewed: 04/29/2019 °Elsevier Patient Education © 2022 Elsevier Inc. ° °

## 2020-10-10 ENCOUNTER — Other Ambulatory Visit: Payer: BC Managed Care – PPO

## 2020-10-10 ENCOUNTER — Encounter: Payer: Self-pay | Admitting: Obstetrics and Gynecology

## 2020-10-10 ENCOUNTER — Other Ambulatory Visit: Payer: Self-pay

## 2020-10-10 ENCOUNTER — Ambulatory Visit (INDEPENDENT_AMBULATORY_CARE_PROVIDER_SITE_OTHER): Payer: BC Managed Care – PPO | Admitting: Obstetrics and Gynecology

## 2020-10-10 VITALS — BP 117/77 | HR 106 | Wt 159.8 lb

## 2020-10-10 DIAGNOSIS — Z1159 Encounter for screening for other viral diseases: Secondary | ICD-10-CM

## 2020-10-10 DIAGNOSIS — Z3402 Encounter for supervision of normal first pregnancy, second trimester: Secondary | ICD-10-CM | POA: Diagnosis not present

## 2020-10-10 DIAGNOSIS — Z2821 Immunization not carried out because of patient refusal: Secondary | ICD-10-CM

## 2020-10-10 DIAGNOSIS — Z3403 Encounter for supervision of normal first pregnancy, third trimester: Secondary | ICD-10-CM

## 2020-10-10 DIAGNOSIS — Z3493 Encounter for supervision of normal pregnancy, unspecified, third trimester: Secondary | ICD-10-CM

## 2020-10-10 DIAGNOSIS — Z3A29 29 weeks gestation of pregnancy: Secondary | ICD-10-CM

## 2020-10-10 DIAGNOSIS — Z3A28 28 weeks gestation of pregnancy: Secondary | ICD-10-CM

## 2020-10-10 LAB — POCT URINALYSIS DIPSTICK OB
Bilirubin, UA: NEGATIVE
Blood, UA: NEGATIVE
Glucose, UA: NEGATIVE
Ketones, UA: NEGATIVE
Leukocytes, UA: NEGATIVE
Nitrite, UA: NEGATIVE
POC,PROTEIN,UA: NEGATIVE
Spec Grav, UA: 1.01 (ref 1.010–1.025)
Urobilinogen, UA: 0.2 E.U./dL
pH, UA: 7 (ref 5.0–8.0)

## 2020-10-10 MED ORDER — PROGESTERONE 200 MG PO CAPS: 400.0000 mg | ORAL_CAPSULE | Freq: Every day | ORAL | 2 refills | Status: DC

## 2020-10-10 NOTE — Progress Notes (Signed)
ROB: She is doing well, no new concerns today. BTC signed. She would like to do her TDAP at a later time.

## 2020-10-10 NOTE — Progress Notes (Signed)
ROB: Patient doing well, no complaints. Now plans to transition to [facility] care as she notes it was too difficult to get established at [facility] with former midwife. For 28 week labs today.  Plans to breastfeed, desires Vasectomy for contraception. Desires to postpone Tdap until postpartum.  Declines flu vaccine. Signed blood consent.  RTC in 2 weeks.

## 2020-10-11 LAB — RPR: RPR Ser Ql: NONREACTIVE

## 2020-10-11 LAB — CBC
Hematocrit: 34.9 % (ref 34.0–46.6)
Hemoglobin: 12.7 g/dL (ref 11.1–15.9)
MCH: 35.8 pg — ABNORMAL HIGH (ref 26.6–33.0)
MCHC: 36.4 g/dL — ABNORMAL HIGH (ref 31.5–35.7)
MCV: 98 fL — ABNORMAL HIGH (ref 79–97)
Platelets: 167 10*3/uL (ref 150–450)
RBC: 3.55 x10E6/uL — ABNORMAL LOW (ref 3.77–5.28)
RDW: 12.4 % (ref 11.7–15.4)
WBC: 5.6 10*3/uL (ref 3.4–10.8)

## 2020-10-11 LAB — GLUCOSE, 1 HOUR GESTATIONAL: Gestational Diabetes Screen: 146 mg/dL — ABNORMAL HIGH (ref 65–139)

## 2020-10-11 LAB — HEPATITIS C ANTIBODY: Hep C Virus Ab: <0.1 s/co ratio (ref 0.0–0.9)

## 2020-10-12 ENCOUNTER — Other Ambulatory Visit: Payer: Self-pay

## 2020-10-12 DIAGNOSIS — Z3483 Encounter for supervision of other normal pregnancy, third trimester: Secondary | ICD-10-CM

## 2020-10-13 ENCOUNTER — Other Ambulatory Visit: Payer: BC Managed Care – PPO

## 2020-10-18 IMAGING — US US ABDOMEN LIMITED
1 series · 14 of 25 positions shown · non-contrast
Comparison: None.

CLINICAL DATA: Right upper quadrant pain

EXAM:
ULTRASOUND ABDOMEN LIMITED RIGHT UPPER QUADRANT

[Series 1: us abdomen limited · 0.14mm/px · 14 of 44 slices shown]
[im 1/44]
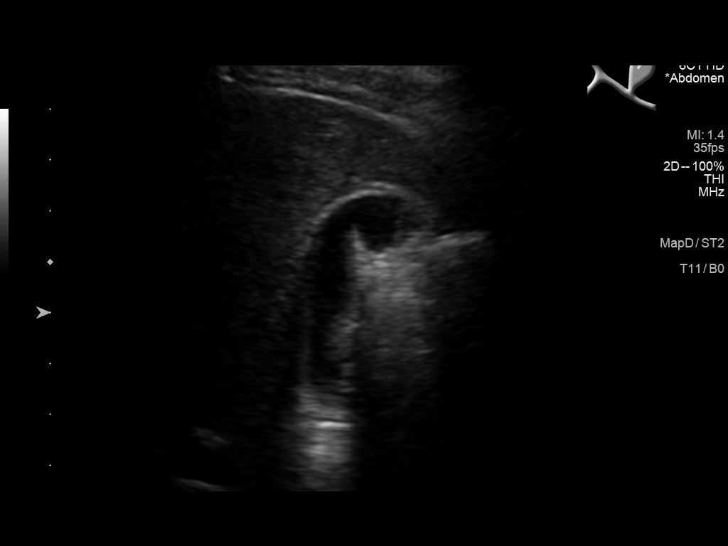
[im 4/44]
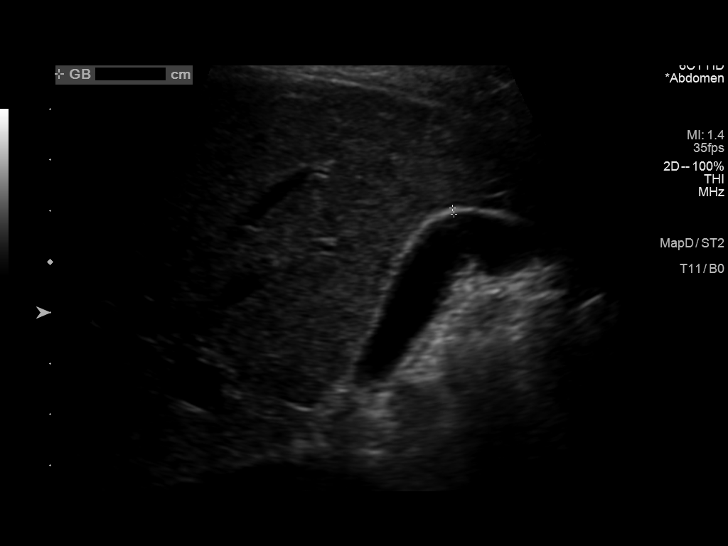
[im 8/44]
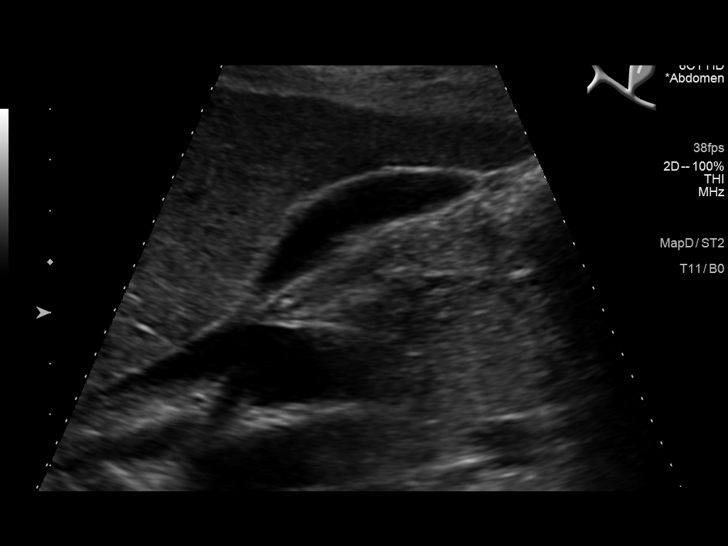
[im 11/44]
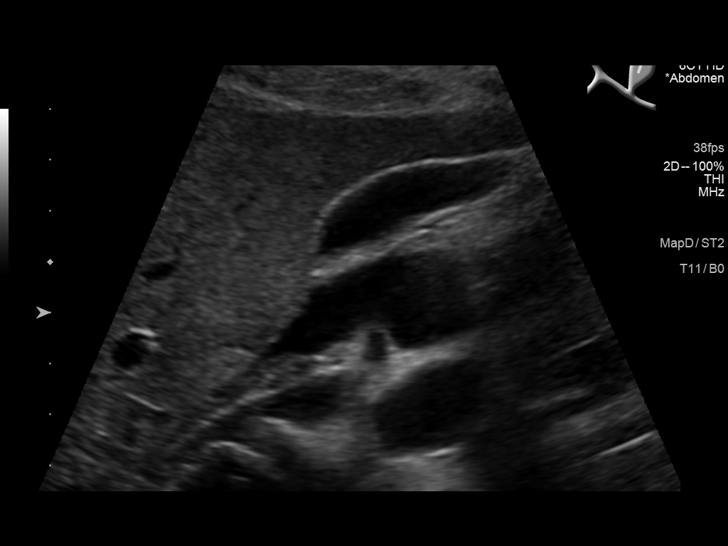
[im 15/44]
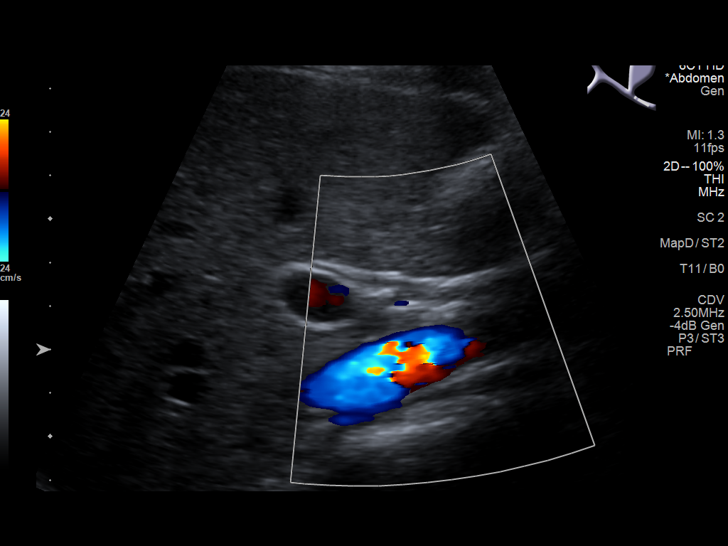
[im 17/44]
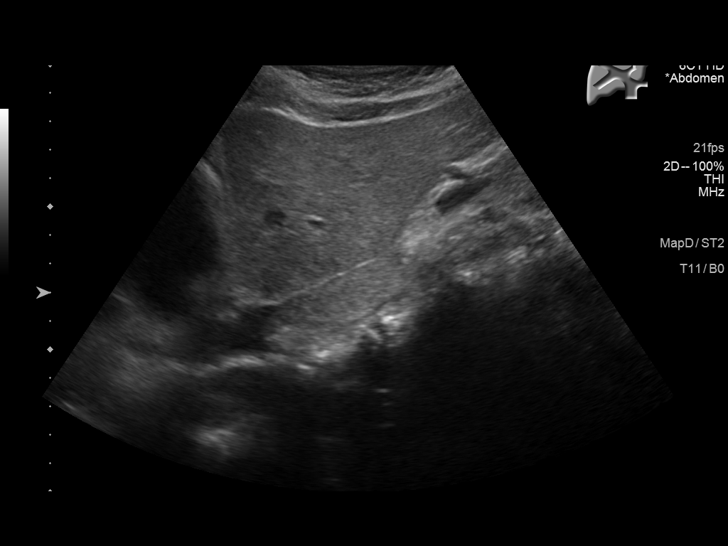
[im 20/44]
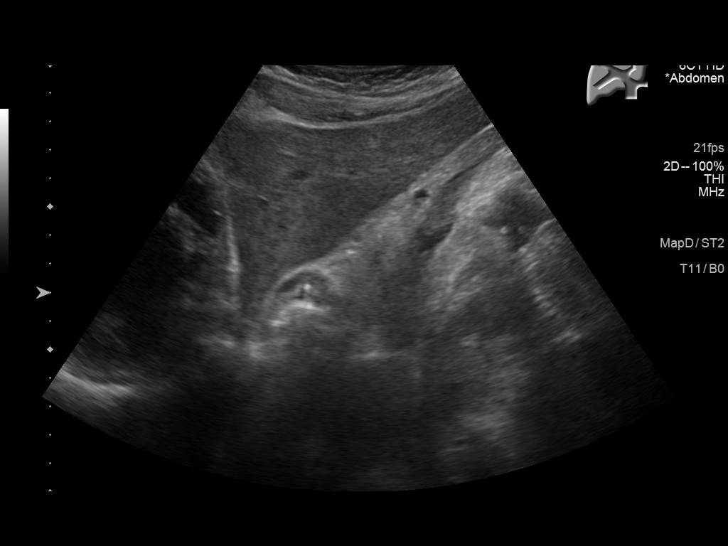
[im 24/44]
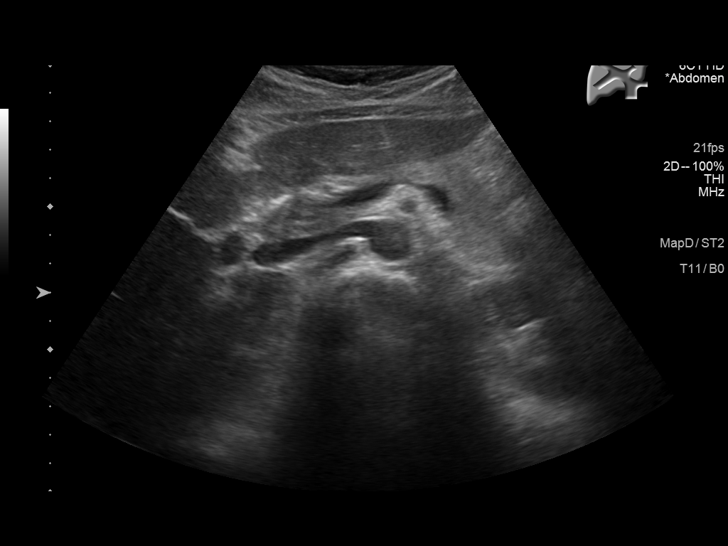
[im 27/44]
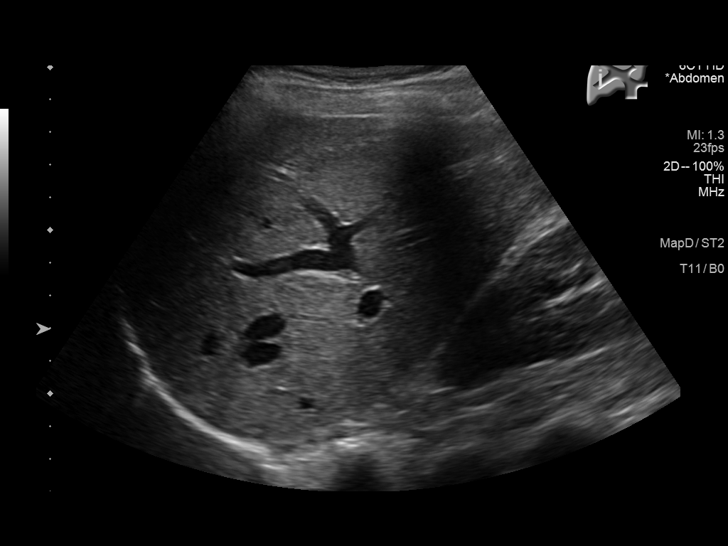
[im 29/44]
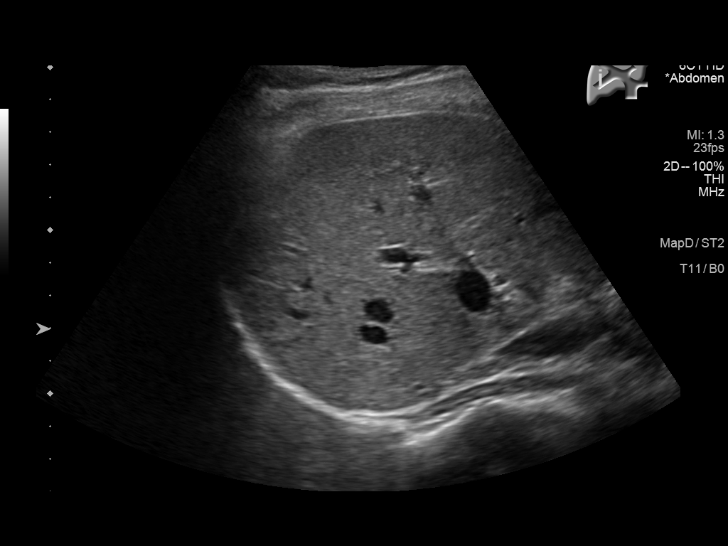
[im 33/44]
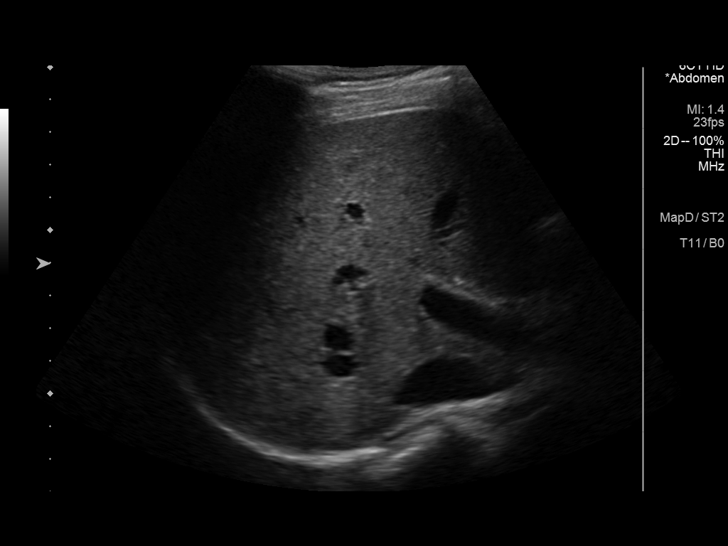
[im 36/44]
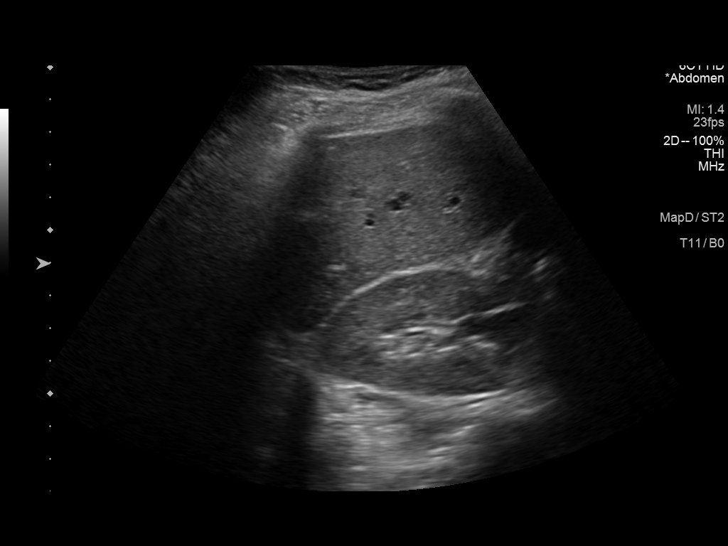
[im 40/44]
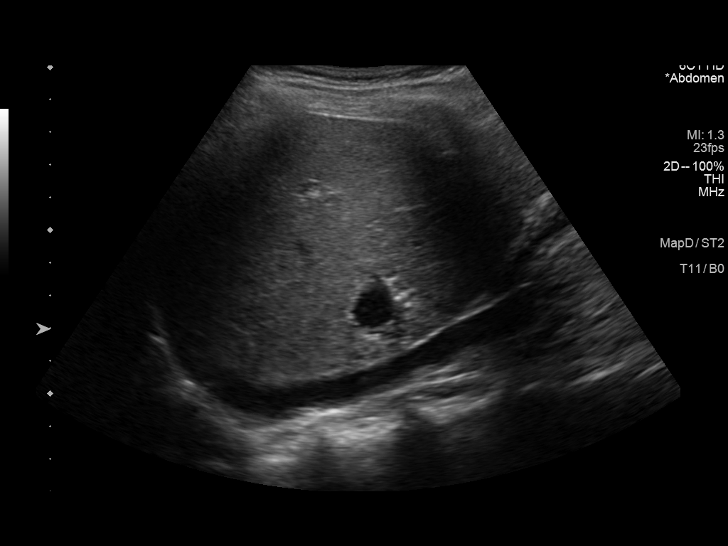
[im 44/44]
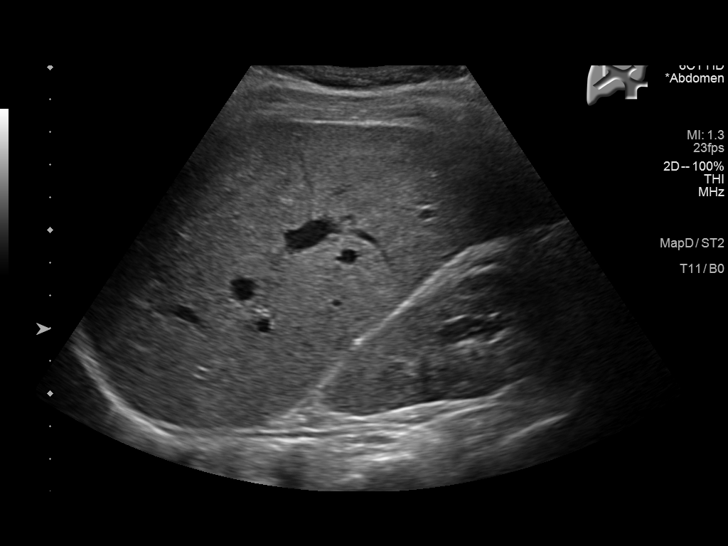

[14 of 25 positions shown; findings below may reference images not displayed]

FINDINGS: Gallbladder:

No gallstones or wall thickening visualized. No sonographic Murphy
sign noted by sonographer.

Common bile duct:

Diameter: 4.1 mm

Liver:

No focal lesion identified. Within normal limits in parenchymal
echogenicity. Portal vein is patent on color Doppler imaging with
normal direction of blood flow towards the liver.

Other: None.
IMPRESSION: Negative right upper quadrant abdominal ultrasound

## 2020-10-25 ENCOUNTER — Other Ambulatory Visit: Payer: BC Managed Care – PPO

## 2020-10-25 ENCOUNTER — Ambulatory Visit (INDEPENDENT_AMBULATORY_CARE_PROVIDER_SITE_OTHER): Payer: BC Managed Care – PPO | Admitting: Obstetrics and Gynecology

## 2020-10-25 ENCOUNTER — Other Ambulatory Visit: Payer: Self-pay

## 2020-10-25 ENCOUNTER — Encounter: Payer: Self-pay | Admitting: Obstetrics and Gynecology

## 2020-10-25 VITALS — BP 103/69 | HR 82 | Wt 157.6 lb

## 2020-10-25 DIAGNOSIS — Z3A31 31 weeks gestation of pregnancy: Secondary | ICD-10-CM

## 2020-10-25 DIAGNOSIS — Z3483 Encounter for supervision of other normal pregnancy, third trimester: Secondary | ICD-10-CM

## 2020-10-25 LAB — POCT URINALYSIS DIPSTICK OB
Bilirubin, UA: NEGATIVE
Blood, UA: NEGATIVE
Glucose, UA: NEGATIVE
Ketones, UA: NEGATIVE
Leukocytes, UA: NEGATIVE
Nitrite, UA: NEGATIVE
POC,PROTEIN,UA: NEGATIVE
Spec Grav, UA: 1.01 (ref 1.010–1.025)
Urobilinogen, UA: 0.2 E.U./dL
pH, UA: 6.5 (ref 5.0–8.0)

## 2020-10-25 NOTE — Progress Notes (Signed)
ROB: No problems.  Reports baby is very active.  Patient had an abnormal 1 hour GCT but declined 3-hour.  Kept strict glucometer for 1 week with no abnormals.  She continues to do occasional fasting blood sugars and they are always less than 100.  Baby oblique with head right lower quadrant.

## 2020-10-25 NOTE — Patient Instructions (Signed)
Breastfeeding and Breast Care It is normal to have some problems when you start to breastfeed your new baby. But there are things that you can do to take care of yourself and help prevent problems. This includes keeping your breasts healthy and making sure that your baby's mouth attaches (latches) properly to your nipple for feedings. Work with your doctor or breastfeeding specialist to find what works best for you. How does self-care benefit me? If you keep your breasts healthy and you let your baby attach to your nipples in the right way, you will avoid these problems: Cracked or sore nipples. Breasts becoming overfilled with milk. Plugged milk ducts. Low milk supply. Breast swelling or infection. How does self-care benefit my baby? By preventing problems with your breasts, you will ensure that your baby will feed well and will gain the right amount of weight. What actions can I take to care for myself during breastfeeding? Best ways to breastfeed Always make sure that your baby latches properly to breastfeed. Make sure that your baby is in a proper position. Try different breastfeeding positions to find one that works best for you and your baby. Breastfeed when you feel like you need to make your breasts less full or when your baby shows signs of hunger. This is called "breastfeeding on demand." Do not delay feedings. Try to relax when it is time to feed your baby. This helps your body release milk from your breast. To help increase milk flow, do these things before feeding: Remove a small amount of milk from your breast. Use a pump or squeeze with your hand. Apply warm, moist heat to your breast. Do this in the shower or use hand towels soaked with warm water. Massage your breasts. Do this when you are breastfeeding as well. Caring for your breasts   To help your breasts stay healthy and keep them from getting too dry: Avoid using soap on your nipples. Let your nipples air-dry for 3-4  minutes after each feeding. Do not use things like a hair dryer to dry your breasts. This can make the skin dry and will cause irritation and pain. Use only cotton bra pads to soak up breast milk that leaks. Change the pads if they become soaked with milk. If you use bra pads that can be thrown away, change them often. Put some lanolin on your nipples after breastfeeding. Pure lanolin does not need to be washed off your nipple before you feed your baby again. Pure lanolin is not harmful to your baby. Rub some breast milk into your nipples: Use your hand to squeeze out a few drops of breast milk. Gently massage the milk into your nipples. Let your nipples air-dry. Wear a supportive nursing bra. Avoid wearing: Tight clothing. Underwire bras or bras that put pressure on your breasts. Use ice to help relieve pain or swelling of your breasts: Put ice in a plastic bag. Place a towel between your skin and the bag. Leave the ice on for 20 minutes, 2-3 times a day. Follow these instructions at home: Drink enough fluid to keep your pee (urine) pale yellow. Get plenty of rest. Sleep when your baby sleeps. Talk to your doctor or breastfeeding specialist before taking any herbal supplements. Eat a balanced diet. This includes fruits, vegetables, whole grains, lean proteins, and dairy or dairy alternatives Contact a health care provider if: You have nipple pain. You have cracking or soreness in your nipples that lasts longer than 1 week. Your breasts are  overfilled with milk, and this lasts longer than 48 hours. You have a fever. You have pus-like fluid coming from your nipple. You have redness, a rash, swelling, itching, or burning on your breast. Your baby does not gain weight. Your baby loses weight. Your baby is not feeding regularly or is very sleepy and lacks energy. Summary There are things that you can do to take care of yourself and help prevent many common breastfeeding problems. Always  make sure that your baby's mouth attaches (latches) to your nipple properly to breastfeed. Keep your nipples from getting too dry, drink plenty of fluid, and get plenty of rest. Feed on demand. Do not delay feedings. This information is not intended to replace advice given to you by your health care provider. Make sure you discuss any questions you have with your health care provider. Document Revised: 07/06/2019 Document Reviewed: 07/06/2019 Elsevier Patient Education  [place].    Common Medications Safe in Pregnancy  Acne:      Constipation:  Benzoyl Peroxide     Colace  Clindamycin      Dulcolax Suppository  Topica Erythromycin     Fibercon  Salicylic Acid      Metamucil         Miralax AVOID:        Senakot   Accutane    Cough:  Retin-A       Cough Drops  Tetracycline      Phenergan w/ Codeine if Rx  Minocycline      Robitussin (Plain & DM)  Antibiotics:     Crabs/Lice:  Ceclor       RID  Cephalosporins    AVOID:  E-Mycins      Kwell  Keflex  Macrobid/Macrodantin   Diarrhea:  Penicillin      Kao-Pectate  Zithromax      Imodium AD         PUSH FLUIDS AVOID:       Cipro     Fever:  Tetracycline      Tylenol (Regular or Extra  Minocycline       Strength)  Levaquin      Extra Strength-Do not          Exceed 8 tabs/24 hrs Caffeine:        <258m/day (equiv. To 1 cup of coffee or  approx. 3 12 oz sodas)         Gas: Cold/Hayfever:       Gas-X  Benadryl      Mylicon  Claritin       Phazyme  **Claritin-D        Chlor-Trimeton    Headaches:  Dimetapp      ASA-Free Excedrin  Drixoral-Non-Drowsy     Cold Compress  Mucinex (Guaifenasin)     Tylenol (Regular or Extra  Sudafed/Sudafed-12 Hour     Strength)  **Sudafed PE Pseudoephedrine   Tylenol Cold & Sinus     Vicks Vapor Rub  Zyrtec  **AVOID if Problems With Blood Pressure         Heartburn: Avoid lying down for at least 1 hour after meals  Aciphex      Maalox     Rash:  Milk of  Magnesia     Benadryl    Mylanta       1% Hydrocortisone Cream  Pepcid  Pepcid Complete   Sleep Aids:  Prevacid      Ambien   Prilosec       Benadryl  Rolaids       Chamomile Tea  Tums (Limit 4/day)     Unisom         Tylenol PM         Warm milk-add vanilla or  Hemorrhoids:       Sugar for taste  Anusol/Anusol H.C.  (RX: Analapram 2.5%)  Sugar Substitutes:  Hydrocortisone OTC     Ok in moderation  Preparation H      Tucks        Vaseline lotion applied to tissue with wiping    Herpes:     Throat:  Acyclovir      Oragel  Famvir  Valtrex     Vaccines:         Flu Shot Leg Cramps:       *Gardasil  Benadryl      Hepatitis A         Hepatitis B Nasal Spray:       Pneumovax  Saline Nasal Spray     Polio Booster         Tetanus Nausea:       Tuberculosis test or PPD  Vitamin B6 25 mg TID   AVOID:    Dramamine      *Gardasil  Emetrol       Live Poliovirus  Ginger Root 250 mg QID    MMR (measles, mumps &  High Complex Carbs @ Bedtime    rebella)  Sea Bands-Accupressure    Varicella (Chickenpox)  Unisom 1/2 tab TID     *No known complications           If received before Pain:         Known pregnancy;   Darvocet       Resume series after  Lortab        Delivery  Percocet    Yeast:   Tramadol      Femstat  Tylenol 3      Gyne-lotrimin  Ultram       Monistat  Vicodin           MISC:         All Sunscreens           Hair Coloring/highlights          Insect Repellant's          (Including DEET)         Mystic Tans

## 2020-10-25 NOTE — Progress Notes (Signed)
OB-Pt present for routine prenatal care. Pt stated that she was having normal pregnancy pain and pressure. Pt declines flu vaccine.

## 2020-11-08 ENCOUNTER — Ambulatory Visit (INDEPENDENT_AMBULATORY_CARE_PROVIDER_SITE_OTHER): Payer: BC Managed Care – PPO | Admitting: Obstetrics and Gynecology

## 2020-11-08 ENCOUNTER — Encounter: Payer: Self-pay | Admitting: Obstetrics and Gynecology

## 2020-11-08 ENCOUNTER — Other Ambulatory Visit: Payer: Self-pay

## 2020-11-08 VITALS — BP 106/74 | HR 86 | Wt 161.8 lb

## 2020-11-08 DIAGNOSIS — Z3483 Encounter for supervision of other normal pregnancy, third trimester: Secondary | ICD-10-CM | POA: Diagnosis not present

## 2020-11-08 DIAGNOSIS — Z3A33 33 weeks gestation of pregnancy: Secondary | ICD-10-CM

## 2020-11-08 LAB — POCT URINALYSIS DIPSTICK OB
Bilirubin, UA: NEGATIVE
Blood, UA: NEGATIVE
Glucose, UA: NEGATIVE
Ketones, UA: NEGATIVE
Leukocytes, UA: NEGATIVE
Nitrite, UA: NEGATIVE
POC,PROTEIN,UA: NEGATIVE
Spec Grav, UA: 1.01 (ref 1.010–1.025)
Urobilinogen, UA: 0.2 E.U./dL
pH, UA: 6.5 (ref 5.0–8.0)

## 2020-11-08 NOTE — Progress Notes (Signed)
ROB: She is tired, but has no concerns.

## 2020-11-08 NOTE — Progress Notes (Signed)
ROB: Doing well overall, noting feeling tired over the past 1-2 days (not pregnancy related).  RTC in 2-3 weeks.

## 2020-11-27 NOTE — Patient Instructions (Signed)

## 2020-11-28 ENCOUNTER — Ambulatory Visit (INDEPENDENT_AMBULATORY_CARE_PROVIDER_SITE_OTHER): Payer: BC Managed Care – PPO | Admitting: Obstetrics and Gynecology

## 2020-11-28 ENCOUNTER — Encounter: Payer: Self-pay | Admitting: Obstetrics and Gynecology

## 2020-11-28 ENCOUNTER — Other Ambulatory Visit: Payer: Self-pay

## 2020-11-28 VITALS — BP 120/90 | HR 94 | Wt 166.2 lb

## 2020-11-28 DIAGNOSIS — O479 False labor, unspecified: Secondary | ICD-10-CM

## 2020-11-28 DIAGNOSIS — Z3483 Encounter for supervision of other normal pregnancy, third trimester: Secondary | ICD-10-CM

## 2020-11-28 DIAGNOSIS — Z3A36 36 weeks gestation of pregnancy: Secondary | ICD-10-CM

## 2020-11-28 LAB — POCT URINALYSIS DIPSTICK OB
Bilirubin, UA: NEGATIVE
Blood, UA: NEGATIVE
Glucose, UA: NEGATIVE
Ketones, UA: NEGATIVE
Leukocytes, UA: NEGATIVE
Nitrite, UA: NEGATIVE
POC,PROTEIN,UA: NEGATIVE
Spec Grav, UA: <=1.005 — AB (ref 1.010–1.025)
Urobilinogen, UA: 0.2 E.U./dL
pH, UA: 6.5 (ref 5.0–8.0)

## 2020-11-28 LAB — OB RESULTS CONSOLE GC/CHLAMYDIA: Gonorrhea: NEGATIVE

## 2020-11-28 NOTE — Progress Notes (Signed)
  OB-Pt present for routine prenatal care. Pt stated fetal movement present; braxton hick contractions present; no vaginal bleeding and no changes in vaginal discharge.     Pt c/o a lot of cramping on right side.  36 week cultures completed today.

## 2020-11-28 NOTE — Progress Notes (Signed)
ROB: Notes Braxton Hicks contractions for the past several days. Plans to stop taking her Prometrium at the end of the week. Discussed signs/symptoms of labor. 36 week cultures performed today. RTC in 1 week.

## 2020-11-30 LAB — STREP GP B NAA: Strep Gp B NAA: NEGATIVE

## 2020-11-30 LAB — GC/CHLAMYDIA PROBE AMP
Chlamydia trachomatis, NAA: NEGATIVE
Neisseria Gonorrhoeae by PCR: NEGATIVE

## 2020-12-05 ENCOUNTER — Encounter: Payer: Self-pay | Admitting: Obstetrics and Gynecology

## 2020-12-05 ENCOUNTER — Other Ambulatory Visit: Payer: Self-pay

## 2020-12-05 ENCOUNTER — Ambulatory Visit (INDEPENDENT_AMBULATORY_CARE_PROVIDER_SITE_OTHER): Payer: BC Managed Care – PPO | Admitting: Obstetrics and Gynecology

## 2020-12-05 VITALS — BP 111/78 | HR 99 | Wt 169.4 lb

## 2020-12-05 DIAGNOSIS — Z3A37 37 weeks gestation of pregnancy: Secondary | ICD-10-CM

## 2020-12-05 DIAGNOSIS — Z3483 Encounter for supervision of other normal pregnancy, third trimester: Secondary | ICD-10-CM

## 2020-12-05 LAB — POCT URINALYSIS DIPSTICK OB
Bilirubin, UA: NEGATIVE
Blood, UA: NEGATIVE
Glucose, UA: NEGATIVE
Ketones, UA: NEGATIVE
Leukocytes, UA: NEGATIVE
Nitrite, UA: NEGATIVE
POC,PROTEIN,UA: NEGATIVE
Spec Grav, UA: 1.01 (ref 1.010–1.025)
Urobilinogen, UA: 0.2 E.U./dL
pH, UA: 6.5 (ref 5.0–8.0)

## 2020-12-05 NOTE — Progress Notes (Signed)
ROB: Patient doing well. Notes a few contractions yesterday.  Discussed labor precautions.  36 week cultures negative.  RTC in 1 week.

## 2020-12-05 NOTE — Progress Notes (Signed)
ROB: She is ready to have her baby. She had a few contractions yesterday.

## 2020-12-11 NOTE — Patient Instructions (Signed)

## 2020-12-12 ENCOUNTER — Ambulatory Visit (INDEPENDENT_AMBULATORY_CARE_PROVIDER_SITE_OTHER): Payer: BC Managed Care – PPO | Admitting: Obstetrics and Gynecology

## 2020-12-12 ENCOUNTER — Encounter: Payer: Self-pay | Admitting: Obstetrics and Gynecology

## 2020-12-12 ENCOUNTER — Other Ambulatory Visit: Payer: Self-pay

## 2020-12-12 VITALS — BP 117/94 | HR 104 | Wt 168.5 lb

## 2020-12-12 DIAGNOSIS — Z3483 Encounter for supervision of other normal pregnancy, third trimester: Secondary | ICD-10-CM

## 2020-12-12 DIAGNOSIS — Z3A38 38 weeks gestation of pregnancy: Secondary | ICD-10-CM

## 2020-12-12 LAB — POCT URINALYSIS DIPSTICK OB
Bilirubin, UA: NEGATIVE
Blood, UA: NEGATIVE
Glucose, UA: NEGATIVE
Ketones, UA: NEGATIVE
Leukocytes, UA: NEGATIVE
Nitrite, UA: NEGATIVE
POC,PROTEIN,UA: NEGATIVE
Spec Grav, UA: <=1.005 — AB (ref 1.010–1.025)
Urobilinogen, UA: 0.2 E.U./dL
pH, UA: 6.5 (ref 5.0–8.0)

## 2020-12-12 NOTE — Progress Notes (Signed)
ROB: Patient denies major complaints.  Has been doing lots of activity (house work, walking) to bring on labor. Discussed labor precautions. RTC in 1 week.

## 2020-12-13 ENCOUNTER — Inpatient Hospital Stay
Admission: EM | Admit: 2020-12-13 | Discharge: 2020-12-15 | DRG: 806 | Disposition: A | Discharge status: Home / Self Care | Payer: BC Managed Care – PPO | Attending: Obstetrics and Gynecology | Admitting: Obstetrics and Gynecology

## 2020-12-13 ENCOUNTER — Encounter: Payer: Self-pay | Admitting: Obstetrics and Gynecology

## 2020-12-13 ENCOUNTER — Ambulatory Visit (INDEPENDENT_AMBULATORY_CARE_PROVIDER_SITE_OTHER): Payer: BC Managed Care – PPO | Admitting: Obstetrics and Gynecology

## 2020-12-13 ENCOUNTER — Inpatient Hospital Stay: Payer: BC Managed Care – PPO | Admitting: Anesthesiology

## 2020-12-13 ENCOUNTER — Other Ambulatory Visit: Payer: Self-pay

## 2020-12-13 VITALS — BP 127/87 | HR 116 | Wt 166.1 lb

## 2020-12-13 DIAGNOSIS — O09523 Supervision of elderly multigravida, third trimester: Secondary | ICD-10-CM | POA: Diagnosis present

## 2020-12-13 DIAGNOSIS — O2243 Hemorrhoids in pregnancy, third trimester: Secondary | ICD-10-CM | POA: Diagnosis present

## 2020-12-13 DIAGNOSIS — Z3A38 38 weeks gestation of pregnancy: Secondary | ICD-10-CM

## 2020-12-13 DIAGNOSIS — Z20822 Contact with and (suspected) exposure to covid-19: Secondary | ICD-10-CM | POA: Diagnosis present

## 2020-12-13 DIAGNOSIS — O4290 Premature rupture of membranes, unspecified as to length of time between rupture and onset of labor, unspecified weeks of gestation: Secondary | ICD-10-CM | POA: Diagnosis present

## 2020-12-13 DIAGNOSIS — N96 Recurrent pregnancy loss: Secondary | ICD-10-CM

## 2020-12-13 DIAGNOSIS — Z23 Encounter for immunization: Secondary | ICD-10-CM | POA: Diagnosis not present

## 2020-12-13 DIAGNOSIS — O4202 Full-term premature rupture of membranes, onset of labor within 24 hours of rupture: Secondary | ICD-10-CM | POA: Diagnosis not present

## 2020-12-13 DIAGNOSIS — Z3483 Encounter for supervision of other normal pregnancy, third trimester: Secondary | ICD-10-CM

## 2020-12-13 DIAGNOSIS — O4292 Full-term premature rupture of membranes, unspecified as to length of time between rupture and onset of labor: Secondary | ICD-10-CM | POA: Diagnosis not present

## 2020-12-13 DIAGNOSIS — Z412 Encounter for routine and ritual male circumcision: Secondary | ICD-10-CM | POA: Diagnosis not present

## 2020-12-13 LAB — CBC
HCT: 35.9 % — ABNORMAL LOW (ref 36.0–46.0)
Hemoglobin: 12.6 g/dL (ref 12.0–15.0)
MCH: 33.6 pg (ref 26.0–34.0)
MCHC: 35.1 g/dL (ref 30.0–36.0)
MCV: 95.7 fL (ref 80.0–100.0)
Platelets: 163 10*3/uL (ref 150–400)
RBC: 3.75 MIL/uL — ABNORMAL LOW (ref 3.87–5.11)
RDW: 13 % (ref 11.5–15.5)
WBC: 5.1 10*3/uL (ref 4.0–10.5)
nRBC: 0 % (ref 0.0–0.2)

## 2020-12-13 LAB — HEPATITIS B SURFACE ANTIGEN: Hepatitis B Surface Ag: NONREACTIVE

## 2020-12-13 LAB — RESP PANEL BY RT-PCR (FLU A&B, COVID) ARPGX2
Influenza A by PCR: NEGATIVE
Influenza B by PCR: NEGATIVE
SARS Coronavirus 2 by RT PCR: NEGATIVE

## 2020-12-13 MED ORDER — BUTORPHANOL TARTRATE 1 MG/ML IJ SOLN: 1.0000 mg | INTRAMUSCULAR | Status: DC | PRN

## 2020-12-13 MED ORDER — PHENYLEPHRINE 40 MCG/ML (10ML) SYRINGE FOR IV PUSH (FOR BLOOD PRESSURE SUPPORT): 80.0000 ug | PREFILLED_SYRINGE | INTRAVENOUS | Status: DC | PRN

## 2020-12-13 MED ORDER — LIDOCAINE HCL (PF) 1 % IJ SOLN
INTRAMUSCULAR | Status: AC
  Filled 2020-12-13: qty 30

## 2020-12-13 MED ORDER — LIDOCAINE-EPINEPHRINE (PF) 1.5 %-1:200000 IJ SOLN
INTRAMUSCULAR | Status: DC | PRN
  Administered 2020-12-13: 3 mL via PERINEURAL

## 2020-12-13 MED ORDER — PRENATAL MULTIVITAMIN CH
1.0000 | ORAL_TABLET | Freq: Every day | ORAL | Status: DC
  Filled 2020-12-13: qty 1

## 2020-12-13 MED ORDER — ONDANSETRON HCL 4 MG/2ML IJ SOLN: 4.0000 mg | INTRAMUSCULAR | Status: DC | PRN

## 2020-12-13 MED ORDER — DIPHENHYDRAMINE HCL 25 MG PO CAPS: 25.0000 mg | ORAL_CAPSULE | Freq: Four times a day (QID) | ORAL | Status: DC | PRN

## 2020-12-13 MED ORDER — BUPIVACAINE HCL (PF) 0.25 % IJ SOLN
INTRAMUSCULAR | Status: DC | PRN
Start: 2020-12-13 — End: 2020-12-13
  Administered 2020-12-13 (×2): 4 mL via EPIDURAL

## 2020-12-13 MED ORDER — WITCH HAZEL-GLYCERIN EX PADS
1.0000 "application " | MEDICATED_PAD | CUTANEOUS | Status: DC | PRN
  Administered 2020-12-13: 1 via TOPICAL
  Filled 2020-12-13 (×2): qty 100

## 2020-12-13 MED ORDER — OXYCODONE-ACETAMINOPHEN 5-325 MG PO TABS: 2.0000 | ORAL_TABLET | ORAL | Status: DC | PRN

## 2020-12-13 MED ORDER — IBUPROFEN 600 MG PO TABS
600.0000 mg | ORAL_TABLET | Freq: Four times a day (QID) | ORAL | Status: DC
  Administered 2020-12-13 – 2020-12-15 (×7): 600 mg via ORAL
  Filled 2020-12-13 (×7): qty 1

## 2020-12-13 MED ORDER — TERBUTALINE SULFATE 1 MG/ML IJ SOLN: 0.2500 mg | Freq: Once | INTRAMUSCULAR | Status: DC | PRN

## 2020-12-13 MED ORDER — DIBUCAINE (PERIANAL) 1 % EX OINT
1.0000 "application " | TOPICAL_OINTMENT | CUTANEOUS | Status: DC | PRN
  Administered 2020-12-13: 1 via RECTAL
  Filled 2020-12-13 (×2): qty 28

## 2020-12-13 MED ORDER — TETANUS-DIPHTH-ACELL PERTUSSIS 5-2.5-18.5 LF-MCG/0.5 IM SUSY
0.5000 mL | PREFILLED_SYRINGE | Freq: Once | INTRAMUSCULAR | Status: AC
  Administered 2020-12-14: 12:00:00 0.5 mL via INTRAMUSCULAR
  Filled 2020-12-13 (×2): qty 0.5

## 2020-12-13 MED ORDER — EPHEDRINE 5 MG/ML INJ: 10.0000 mg | INTRAVENOUS | Status: DC | PRN

## 2020-12-13 MED ORDER — AMMONIA AROMATIC IN INHA
RESPIRATORY_TRACT | Status: AC
  Filled 2020-12-13: qty 10

## 2020-12-13 MED ORDER — SIMETHICONE 80 MG PO CHEW: 80.0000 mg | CHEWABLE_TABLET | ORAL | Status: DC | PRN

## 2020-12-13 MED ORDER — DIPHENHYDRAMINE HCL 50 MG/ML IJ SOLN: 12.5000 mg | INTRAMUSCULAR | Status: DC | PRN

## 2020-12-13 MED ORDER — COCONUT OIL OIL
1.0000 "application " | TOPICAL_OIL | Status: DC | PRN
  Filled 2020-12-13: qty 120

## 2020-12-13 MED ORDER — SOD CITRATE-CITRIC ACID 500-334 MG/5ML PO SOLN: 30.0000 mL | ORAL | Status: DC | PRN

## 2020-12-13 MED ORDER — OXYTOCIN-SODIUM CHLORIDE 30-0.9 UT/500ML-% IV SOLN
1.0000 m[IU]/min | INTRAVENOUS | Status: DC
  Administered 2020-12-13: 4 m[IU]/min via INTRAVENOUS
  Filled 2020-12-13: qty 500

## 2020-12-13 MED ORDER — ONDANSETRON HCL 4 MG PO TABS: 4.0000 mg | ORAL_TABLET | ORAL | Status: DC | PRN

## 2020-12-13 MED ORDER — LACTATED RINGERS IV SOLN
500.0000 mL | INTRAVENOUS | Status: DC | PRN
Start: 2020-12-13 — End: 2020-12-13

## 2020-12-13 MED ORDER — FENTANYL-BUPIVACAINE-NACL 0.5-0.125-0.9 MG/250ML-% EP SOLN
12.0000 mL/h | EPIDURAL | Status: DC | PRN
  Administered 2020-12-13: 12 mL/h via EPIDURAL

## 2020-12-13 MED ORDER — LACTATED RINGERS IV SOLN: 500.0000 mL | Freq: Once | INTRAVENOUS | Status: DC

## 2020-12-13 MED ORDER — OXYCODONE-ACETAMINOPHEN 5-325 MG PO TABS: 1.0000 | ORAL_TABLET | ORAL | Status: DC | PRN

## 2020-12-13 MED ORDER — LIDOCAINE HCL (PF) 1 % IJ SOLN: 30.0000 mL | INTRAMUSCULAR | Status: DC | PRN

## 2020-12-13 MED ORDER — LACTATED RINGERS IV BOLUS
1000.0000 mL | Freq: Once | INTRAVENOUS | Status: AC
  Administered 2020-12-13: 1000 mL via INTRAVENOUS

## 2020-12-13 MED ORDER — FENTANYL-BUPIVACAINE-NACL 0.5-0.125-0.9 MG/250ML-% EP SOLN
EPIDURAL | Status: AC
  Filled 2020-12-13: qty 250

## 2020-12-13 MED ORDER — ZOLPIDEM TARTRATE 5 MG PO TABS: 5.0000 mg | ORAL_TABLET | Freq: Every evening | ORAL | Status: DC | PRN

## 2020-12-13 MED ORDER — MISOPROSTOL 200 MCG PO TABS
ORAL_TABLET | ORAL | Status: AC
  Filled 2020-12-13: qty 4

## 2020-12-13 MED ORDER — OXYTOCIN BOLUS FROM INFUSION
333.0000 mL | Freq: Once | INTRAVENOUS | Status: AC
  Administered 2020-12-13: 333 mL via INTRAVENOUS

## 2020-12-13 MED ORDER — BENZOCAINE-MENTHOL 20-0.5 % EX AERO
1.0000 "application " | INHALATION_SPRAY | CUTANEOUS | Status: DC | PRN
  Administered 2020-12-13: 1 via TOPICAL
  Filled 2020-12-13 (×2): qty 56

## 2020-12-13 MED ORDER — LACTATED RINGERS IV SOLN
125.0000 mL/h | INTRAVENOUS | Status: DC
  Administered 2020-12-13: 125 mL/h via INTRAVENOUS

## 2020-12-13 MED ORDER — SENNOSIDES-DOCUSATE SODIUM 8.6-50 MG PO TABS
2.0000 | ORAL_TABLET | Freq: Every day | ORAL | Status: DC
  Administered 2020-12-14 – 2020-12-15 (×2): 2 via ORAL
  Filled 2020-12-13 (×2): qty 2

## 2020-12-13 MED ORDER — OXYTOCIN 10 UNIT/ML IJ SOLN
INTRAMUSCULAR | Status: AC
  Filled 2020-12-13: qty 2

## 2020-12-13 MED ORDER — ACETAMINOPHEN 325 MG PO TABS: 650.0000 mg | ORAL_TABLET | ORAL | Status: DC | PRN

## 2020-12-13 MED ORDER — ONDANSETRON HCL 4 MG/2ML IJ SOLN: 4.0000 mg | Freq: Four times a day (QID) | INTRAMUSCULAR | Status: DC | PRN

## 2020-12-13 MED ORDER — ACETAMINOPHEN 325 MG PO TABS
650.0000 mg | ORAL_TABLET | ORAL | Status: DC | PRN
  Administered 2020-12-13 – 2020-12-14 (×2): 650 mg via ORAL
  Filled 2020-12-13 (×2): qty 2

## 2020-12-13 MED ORDER — OXYTOCIN-SODIUM CHLORIDE 30-0.9 UT/500ML-% IV SOLN: 2.5000 [IU]/h | INTRAVENOUS | Status: DC

## 2020-12-13 NOTE — H&P (Signed)
Obstetric History and Physical  JACQUALYN SEDGWICK is a 35 y.o. [phone] with IUP at [redacted]w[redacted]d presenting for complaints of ruptured membranes since this morning after taking a shower (clear ~ 0715,  fluid). Patient states she has been having irregular mild contractions,  scant  vaginal bleeding, with  good  fetal movement.    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   Prenatal labs and studies: ABO, Rh: --/--/PENDING (11/16 1027) Antibody: PENDING (11/16 1027) Rubella: 2.15 (04/29 1002) RPR: Non Reactive (09/13 0928)  HBsAg:  PENDING  HIV: Non Reactive (04/29 1002)  WLN:LGXQJJHE/-- (11/01 1342) 1 hr Glucola  normal Genetic screening normal Anatomy US normal   Past Medical History:  Diagnosis Date   Amenorrhea    History of UTI     Past Surgical History:  Procedure Laterality Date   BREAST ENHANCEMENT SURGERY      OB History  Gravida Para Term Preterm AB Living  6 2 2   3 2   SAB IAB Ectopic Multiple Live Births  3     0 2    # Outcome Date GA Lbr Len/2nd Weight Sex Delivery Anes PTL Lv  6 Current           5 Term 09/29/16 [redacted]w[redacted]d / 00:12 3180 g F Vag-Spont EPI  LIV  4 Term 2011    F Vag-Spont  N LIV  3 SAB           2 SAB           1 SAB             Social History   Socioeconomic History   Marital status: Married    Spouse name: [id number]   Number of children: Not on file   Years of education: Not on file   Highest education level: Not on file  Occupational History   Not on file  Tobacco Use   Smoking status: Never   Smokeless tobacco: Never   Vaping Use   Vaping Use: Never used  Substance and Sexual Activity   Alcohol use: Not Currently    Comment: occas/ not during pregnancy   Drug use: No   Sexual activity: Yes    Comment: vasectomy  Other Topics Concern   Not on file  Social History Narrative   Not on file   Social Determinants of Health   Financial Resource Strain: Not on file  Food Insecurity: Not on file  Transportation Needs: Not on file  Physical Activity: Not on file  Stress: Not on file  Social Connections: Not on file    Family History  Problem Relation Age of Onset   COPD Mother    Cancer Neg Hx    Heart failure Neg Hx    Diabetes Neg Hx     Medications Prior to Admission  Medication Sig Dispense Refill Last Dose   aspirin EC 81 MG tablet Take 1 tablet (81 mg total) by mouth daily. 300 tablet 2 12/12/2020   prenatal vitamin w/FE, FA (PRENATAL 1 + 1) 27-1 MG TABS tablet Take 1 tablet by mouth daily at 12 noon.  12/12/2020   Probiotic Product (PROBIOTIC ADVANCED PO) Take by mouth.       Allergies  Allergen Reactions   Sulfa Antibiotics     Review of Systems: Negative except for what is mentioned in HPI.  Physical Exam: BP (!) 131/91 (BP Location: Right Arm)   Pulse 89   Temp 97.9 F (36.6 C) (Oral)   Resp 16   Ht 5\' 2"  (1.575 m)   Wt 75.3 kg   LMP 03/20/2020   BMI 30.38 kg/m  CONSTITUTIONAL: Well-developed, well-nourished female in no acute distress.  HENT:  Normocephalic, atraumatic, External right and left ear normal. Oropharynx is clear and moist EYES: Conjunctivae and EOM are normal. Pupils are equal, round, and reactive to light. No scleral icterus.  NECK: Normal range of motion, supple, no masses SKIN: Skin is warm and dry. No rash noted. Not diaphoretic. No erythema. No pallor. NEUROLOGIC: Alert and oriented to person, place, and time. Normal reflexes, muscle tone coordination. No cranial nerve deficit noted. PSYCHIATRIC: Normal mood and affect. Normal behavior. Normal  judgment and thought content. CARDIOVASCULAR: Normal heart rate noted, regular rhythm RESPIRATORY: Effort and breath sounds normal, no problems with respiration noted ABDOMEN: Soft, nontender, nondistended, gravid. MUSCULOSKELETAL: Normal range of motion. No edema and no tenderness. 2+ distal pulses.  Cervical Exam: Dilatation 3 cm   Effacement 60%   Station -3   Presentation: cephalic FHT:  Baseline rate 130 bpm   Variability moderate  Accelerations present   Decelerations none Contractions: Occasional    Pertinent Labs/Studies:   Results for orders placed or performed during the hospital encounter of 12/13/20 (from the past 24 hour(s))  CBC     Status: Abnormal   Collection Time: 12/13/20 10:27 AM  Result Value Ref Range   WBC 5.1 4.0 - 10.5 K/uL   RBC 3.75 (L) 3.87 - 5.11 MIL/uL   Hemoglobin 12.6 12.0 - 15.0 g/dL   HCT 12/15/20 (L) 23.5 - 57.3 %   MCV 95.7 80.0 - 100.0 fL   MCH 33.6 26.0 - 34.0 pg   MCHC 35.1 30.0 - 36.0 g/dL   RDW 22.0 25.4 - 27.0 %   Platelets 163 150 - 400 K/uL   nRBC 0.0 0.0 - 0.2 %  Type and screen     Status: None (Preliminary result)   Collection Time: 12/13/20 10:27 AM  Result Value Ref Range   ABO/RH(D) PENDING    Antibody Screen PENDING    Sample Expiration      12/16/2020,2359 Performed at [facility], [address], [place], [place]     Assessment : TALYN EDDIE is a 35 y.o. 31 at [redacted]w[redacted]d being admitted for induction of labor due to PROM.  Plan: Labor: Induction/Augmentation with Pitocin as ordered as per protocol. Analgesia as needed. Admission labs pending.  FWB: Reassuring fetal heart tracing.  GBS negative Delivery plan: Hopeful for vaginal delivery. For Tdap postpartum.     [redacted]w[redacted]d, MD [facility]

## 2020-12-13 NOTE — Progress Notes (Signed)
ROB: She has been leaking fluid since this morning. Unable to leave urine sample @ intake.

## 2020-12-13 NOTE — Anesthesia Procedure Notes (Signed)
Epidural Patient location during procedure: OB Start time: 12/13/2020 2:02 PM End time: 12/13/2020 2:06 PM  Staffing Anesthesiologist: Corinda Gubler, MD Resident/CRNA: Stormy Fabian, CRNA Performed: resident/CRNA   Preanesthetic Checklist Completed: patient identified, IV checked, site marked, risks and benefits discussed, surgical consent, monitors and equipment checked, pre-op evaluation and timeout performed  Epidural Patient position: sitting Prep: ChloraPrep Patient monitoring: heart rate, continuous pulse ox and blood pressure Approach: midline Location: L3-L4 Injection technique: LOR saline  Needle:  Needle type: Tuohy  Needle gauge: 17 G Needle length: 9 cm and 9 Needle insertion depth: 6 cm Catheter type: closed end flexible Catheter size: 19 Gauge Catheter at skin depth: 10 cm Test dose: negative and 1.5% lidocaine with Epi 1:200 K  Assessment Sensory level: T10 Events: blood not aspirated, injection not painful, no injection resistance, no paresthesia and negative IV test  Additional Notes 1 attempt Pt. Evaluated and documentation done after procedure finished. Patient identified. Risks/Benefits/Options discussed with patient including but not limited to bleeding, infection, nerve damage, paralysis, failed block, incomplete pain control, headache, blood pressure changes, nausea, vomiting, reactions to medication both or allergic, itching and postpartum back pain. Confirmed with bedside nurse the patient's most recent platelet count. Confirmed with patient that they are not currently taking any anticoagulation, have any bleeding history or any family history of bleeding disorders. Patient expressed understanding and wished to proceed. All questions were answered. Sterile technique was used throughout the entire procedure. Please see nursing notes for vital signs. Test dose was given through epidural catheter and negative prior to continuing to dose epidural or start  infusion. Warning signs of high block given to the patient including shortness of breath, tingling/numbness in hands, complete motor block, or any concerning symptoms with instructions to call for help. Patient was given instructions on fall risk and not to get out of bed. All questions and concerns addressed with instructions to call with any issues or inadequate analgesia.    Patient tolerated the insertion well without immediate complications.Reason for block:procedure for pain

## 2020-12-13 NOTE — Anesthesia Preprocedure Evaluation (Signed)
Anesthesia Evaluation  Patient identified by MRN, date of birth, ID band Patient awake    Reviewed: Allergy & Precautions, H&P , NPO status , Patient's Chart, lab work & pertinent test results  History of Anesthesia Complications Negative for: history of anesthetic complications  Airway Mallampati: II  TM Distance: >3 FB     Dental no notable dental hx.    Pulmonary neg pulmonary ROS,    Pulmonary exam normal        Cardiovascular negative cardio ROS Normal cardiovascular exam     Neuro/Psych negative neurological ROS  negative psych ROS   GI/Hepatic negative GI ROS, Neg liver ROS,   Endo/Other  negative endocrine ROS  Renal/GU negative Renal ROS  negative genitourinary   Musculoskeletal   Abdominal   Peds  Hematology negative hematology ROS (+)   Anesthesia Other Findings   Reproductive/Obstetrics (+) Pregnancy                             Anesthesia Physical Anesthesia Plan  ASA: 2  Anesthesia Plan: Epidural   Post-op Pain Management:    Induction:   PONV Risk Score and Plan:   Airway Management Planned:   Additional Equipment:   Intra-op Plan:   Post-operative Plan:   Informed Consent: I have reviewed the patients History and Physical, chart, labs and discussed the procedure including the risks, benefits and alternatives for the proposed anesthesia with the patient or authorized representative who has indicated his/her understanding and acceptance.       Plan Discussed with: Anesthesiologist  Anesthesia Plan Comments:         Anesthesia Quick Evaluation

## 2020-12-13 NOTE — Progress Notes (Signed)
Problem OB: Patient complains of leaking fluid this morning after getting out of the shower.  Felt a trickle. Put on a pad then felt it again. Called the office and was instructed to come in for evaluation. Has been having some irregular contractions this morning. Nitrazine positive in office, small amount of pooling present in vaginal vault. Will send to L&D.

## 2020-12-13 NOTE — Progress Notes (Addendum)
Pt is a [id number] at [redacted]w[redacted]d that was sent to L&D with confirmed ROM in office. Pt is currently contracting occasionally and rating them 3/10 scale. Provider aware of pt arrival to unit.

## 2020-12-14 LAB — CBC
HCT: 32.7 % — ABNORMAL LOW (ref 36.0–46.0)
Hemoglobin: 11.3 g/dL — ABNORMAL LOW (ref 12.0–15.0)
MCH: 33.7 pg (ref 26.0–34.0)
MCHC: 34.6 g/dL (ref 30.0–36.0)
MCV: 97.6 fL (ref 80.0–100.0)
Platelets: 134 10*3/uL — ABNORMAL LOW (ref 150–400)
RBC: 3.35 MIL/uL — ABNORMAL LOW (ref 3.87–5.11)
RDW: 13.1 % (ref 11.5–15.5)
WBC: 9.1 10*3/uL (ref 4.0–10.5)
nRBC: 0 % (ref 0.0–0.2)

## 2020-12-14 LAB — RPR: RPR Ser Ql: NONREACTIVE

## 2020-12-14 MED ORDER — IBUPROFEN 600 MG PO TABS: 600.0000 mg | ORAL_TABLET | Freq: Four times a day (QID) | ORAL | 0 refills | Status: DC | PRN

## 2020-12-14 NOTE — Progress Notes (Signed)
Post Partum Day # 1, s/p SVD  Subjective: no complaints, up ad lib, voiding, and tolerating PO  Objective: Temp:  [97.6 F (36.4 C)-98.4 F (36.9 C)] 98 F (36.7 C) (11/17 1513) Pulse Rate:  [78-104] 78 (11/17 1513) Resp:  [16-18] 18 (11/17 1513) BP: (115-135)/(80-97) 125/84 (11/17 1513) SpO2:  [98 %-100 %] 99 % (11/17 0838)  Physical Exam:  General: alert and no distress  Lungs: clear to auscultation bilaterally Breasts: normal appearance, no masses or tenderness Heart: regular rate and rhythm, S1, S2 normal, no murmur, click, rub or gallop Abdomen: soft, non-tender; bowel sounds normal; no masses,  no organomegaly Pelvis: Lochia: appropriate, Uterine Fundus: firm Extremities: DVT Evaluation: No evidence of DVT seen on physical exam. Negative Homan's sign. No cords or calf tenderness. No significant calf/ankle edema.  Recent Labs    12/13/20 1027 12/14/20 0534  HGB 12.6 11.3*  HCT 35.9* 32.7*    Assessment/Plan: Doing well postpartum Breastfeeding, Lactation consult as needed Circumcision prior to discharge Contraception: vasectomy Tdap postpartum Plan for discharge tomorrow   LOS: 1 day   Hildred Laser, MD [facility] 12/14/2020 5:29 PM

## 2020-12-15 DIAGNOSIS — O09523 Supervision of elderly multigravida, third trimester: Secondary | ICD-10-CM | POA: Diagnosis present

## 2020-12-15 LAB — TYPE AND SCREEN
ABO/RH(D): O POS
Antibody Screen: POSITIVE
PT AG Type: POSITIVE
Unit division: 0
Unit division: 0

## 2020-12-15 LAB — BPAM RBC
Blood Product Expiration Date: 202212012359
Blood Product Expiration Date: 202212202359
Unit Type and Rh: 5100
Unit Type and Rh: 9500

## 2020-12-15 NOTE — Anesthesia Postprocedure Evaluation (Signed)
Anesthesia Post Note  Patient: Jenny Mcguire  Procedure(s) Performed: AN AD HOC LABOR EPIDURAL  Patient location during evaluation: Mother Baby Anesthesia Type: Epidural Level of consciousness: oriented and awake and alert Pain management: pain level controlled Vital Signs Assessment: post-procedure vital signs reviewed and stable Respiratory status: spontaneous breathing and respiratory function stable Cardiovascular status: blood pressure returned to baseline and stable Postop Assessment: no headache, no backache, no apparent nausea or vomiting and able to ambulate Anesthetic complications: no   No notable events documented.   Last Vitals:  Vitals:   12/14/20 2327 12/15/20 0812  BP: 114/84 128/89  Pulse: 85 77  Resp: 16 20  Temp: 36.6 C 36.8 C  SpO2: 98% 99%    Last Pain:  Vitals:   12/15/20 0815  TempSrc:   PainSc: 0-No pain                 Starling Manns

## 2020-12-15 NOTE — Progress Notes (Signed)
Patient discharged home with family.  Discharge instructions, when to follow up, and prescriptions reviewed with patient.  Patient verbalized understanding. Patient will be escorted out by auxiliary.   

## 2020-12-15 NOTE — Discharge Summary (Signed)
Postpartum Discharge Summary      Patient Name: Jenny Mcguire DOB: 1985/12/01 MRN: [id number]  Date of admission: 12/13/2020 Delivery date:12/13/2020  Delivering provider: Rubie Maid  Date of discharge: 12/15/2020  Admitting diagnosis: PROM (premature rupture of membranes) [O42.90] Intrauterine pregnancy: [redacted]w[redacted]d    Secondary diagnosis:  Active Problems:   PROM (premature rupture of membranes)   Advanced maternal age in multigravida, third trimester  Additional problems: Need for Tdap vaccination, history of multiple miscarriages    Discharge diagnosis: Term Pregnancy Delivered                                              Post partum procedures: None Augmentation: Pitocin Complications: None  Hospital course: Induction of Labor With Vaginal Delivery   35y.o. yo [phone] [id number] admitted to the hospital 12/13/2020 for induction of labor.  Indication for induction: PROM.  Patient had an uncomplicated labor course as follows: Membrane Rupture Time/Date: 10:00 AM ,12/13/2020   Delivery Method:Vaginal, Spontaneous  Episiotomy: None  Lacerations:  None  Details of delivery can be found in separate delivery note.  Patient had a routine postpartum course. Patient is discharged home 12/15/20.  Newborn Data: Birth date:12/13/2020  Birth time:4:02 PM  Gender:Female  Living status:Living  Apgars:8 ,9  Weight:3370 g   Magnesium Sulfate received: No BMZ received: No Rhophylac:No MMR:No T-DaP:Given postpartum Flu: No (declined) Transfusion:No  Physical exam  Vitals:   12/14/20 0311 12/14/20 0838 12/14/20 1513 12/14/20 2327  BP: 119/84 115/83 125/84 114/84  Pulse: 90 93 78 85  Resp: '18 18 18 16  ' Temp: 97.9 F (36.6 C) 98 F (36.7 C) 98 F (36.7 C) 97.8 F (36.6 C)  TempSrc: Oral Oral Oral Oral  SpO2: 99% 99%  98%  Weight:      Height:       General: alert, cooperative, and no distress Lochia: appropriate Uterine Fundus: firm Incision: N/A DVT  Evaluation: No evidence of DVT seen on physical exam. Negative Homan's sign. No cords or calf tenderness. No significant calf/ankle edema.   Labs: Lab Results  Component Value Date   WBC 9.1 12/14/2020   HGB 11.3 (L) 12/14/2020   HCT 32.7 (L) 12/14/2020   MCV 97.6 12/14/2020   PLT 134 (L) 12/14/2020    CMP Latest Ref Rng & Units 05/17/2019  Glucose 70 - 99 mg/dL 97  BUN 6 - 20 mg/dL 12  Creatinine 0.44 - 1.00 mg/dL 0.66  Sodium 135 - 145 mmol/L 138  Potassium 3.5 - 5.1 mmol/L 3.6  Chloride 98 - 111 mmol/L 104  CO2 22 - 32 mmol/L 27  Calcium 8.9 - 10.3 mg/dL 9.7    Edinburgh Score: Edinburgh Postnatal Depression Scale Screening Tool 12/14/2020  I have been able to laugh and see the funny side of things. 0  I have looked forward with enjoyment to things. 0  I have blamed myself unnecessarily when things went wrong. 0  I have been anxious or worried for no good reason. 0  I have felt scared or panicky for no good reason. 0  Things have been getting on top of me. 1  I have been so unhappy that I have had difficulty sleeping. 0  I have felt sad or miserable. 0  I have been so unhappy that I have been crying. 0  The thought of  harming myself has occurred to me. 0  Edinburgh Postnatal Depression Scale Total 1     After visit meds:  Allergies as of 12/15/2020       Reactions   Sulfa Antibiotics         Medication List     STOP taking these medications    aspirin EC 81 MG tablet       TAKE these medications    ibuprofen 600 MG tablet Commonly known as: ADVIL Take 1 tablet (600 mg total) by mouth every 6 (six) hours as needed.   prenatal vitamin w/FE, FA 27-1 MG Tabs tablet Take 1 tablet by mouth daily at 12 noon.   PROBIOTIC ADVANCED PO Take by mouth.         Discharge home in stable condition Infant Feeding: Breast Infant Disposition:home with mother Discharge instruction: per After Visit Summary and Postpartum booklet. Activity: Advance as  tolerated. Pelvic rest for 6 weeks.  Diet: routine diet Anticipated Birth Control:  Vasectomy Postpartum Appointment:6 weeks Additional Postpartum F/U: Postpartum Depression checkup in check up  Future Appointments: Future Appointments  Date Time Provider [place]  12/19/2020  9:45 AM Rubie Maid, MD [facility] None   Follow up Visit:  Follow-up Information     Rubie Maid, MD Follow up.   Specialties: Obstetrics and Gynecology, Radiology Why: 2-3 week mood check televisit 6 week postpartum visit Contact information: [address] [place] [address] [phone]                    12/15/2020 Rubie Maid, MD

## 2020-12-15 NOTE — Discharge Instructions (Signed)

## 2020-12-19 ENCOUNTER — Encounter: Payer: BC Managed Care – PPO | Admitting: Obstetrics and Gynecology

## 2020-12-26 ENCOUNTER — Encounter: Payer: Self-pay | Admitting: Obstetrics and Gynecology

## 2020-12-29 NOTE — Progress Notes (Signed)
    Virtual Visit via Video Note  I connected with Jenny Mcguire on 01/01/21 at  9:45 AM EST by a video enabled telemedicine application and verified that I am speaking with the correct person using two identifiers.  Location: Patient: Home Provider: Office   I discussed the limitations of evaluation and management by telemedicine and the availability of in person appointments. The patient expressed understanding and agreed to proceed.  History of Present Illness:  Jenny Mcguire is a 35 y.o. [phone] female who presents for a postpartum visit. She is 2 week postpartum following a spontaneous vaginal delivery. I have fully reviewed the prenatal and intrapartum course. The delivery was at 38 gestational weeks. She notes  bleeding is within normal limits. Breastfeeding is going well. Denies any mood issues.    Observations/Objective: Height 5\' 2"  (1.575 m), weight 151 lb (68.5 kg), currently breastfeeding. Gen App: NAD Psych: Normal mood, affect.  Normal speech.    Edinburgh Postnatal Depression Scale Screening Tool 01/01/2021 11/19/2016  I have been able to laugh and see the funny side of things. 0 0  I have looked forward with enjoyment to things. 0 0  I have blamed myself unnecessarily when things went wrong. 0 0  I have been anxious or worried for no good reason. 0 0  I have felt scared or panicky for no good reason. 0 0  Things have been getting on top of me. 0 1  I have been so unhappy that I have had difficulty sleeping. 0 0  I have felt sad or miserable. 0 0  I have been so unhappy that I have been crying. 0 0  The thought of harming myself has occurred to me. 0 0  Edinburgh Postnatal Depression Scale Total 0 1  Some encounter information is confidential and restricted. Go to Review Flowsheets activity to see all data.      Assessment and Plan:  Screening for maternal depression - doing well, no mood changes. To rescreen at 6 week postpartum visit.  Postpartum states  s/p vaginal delivery  - doing well, no major issues. Continue routine care.  Lactating mother - doing well with breastfeeding, notes ample supply. No signs of mastitis.   Follow Up Instructions:    I discussed the assessment and treatment plan with the patient. The patient was provided an opportunity to ask questions and all were answered. The patient agreed with the plan and demonstrated an understanding of the instructions.   The patient was advised to call back or seek an in-person evaluation if the symptoms worsen or if the condition fails to improve as anticipated.  I provided 5 minutes of non-face-to-face time during this encounter.   11/21/2016, MD [facility]

## 2021-01-01 ENCOUNTER — Encounter: Payer: Self-pay | Admitting: Obstetrics and Gynecology

## 2021-01-01 ENCOUNTER — Telehealth (INDEPENDENT_AMBULATORY_CARE_PROVIDER_SITE_OTHER): Payer: BC Managed Care – PPO | Admitting: Obstetrics and Gynecology

## 2021-01-01 VITALS — Ht 62.0 in | Wt 151.0 lb

## 2021-01-01 DIAGNOSIS — Z1332 Encounter for screening for maternal depression: Secondary | ICD-10-CM

## 2021-01-02 ENCOUNTER — Encounter: Payer: BC Managed Care – PPO | Admitting: Obstetrics and Gynecology

## 2021-01-24 ENCOUNTER — Encounter: Payer: Self-pay | Admitting: Obstetrics and Gynecology

## 2021-01-24 ENCOUNTER — Other Ambulatory Visit: Payer: Self-pay

## 2021-01-24 ENCOUNTER — Ambulatory Visit (INDEPENDENT_AMBULATORY_CARE_PROVIDER_SITE_OTHER): Payer: BC Managed Care – PPO | Admitting: Obstetrics and Gynecology

## 2021-01-24 NOTE — Patient Instructions (Signed)

## 2021-01-24 NOTE — Progress Notes (Signed)
° °  OBSTETRICS POSTPARTUM CLINIC PROGRESS NOTE  Subjective:     Jenny Mcguire is a 35 y.o. [phone] female who presents for a postpartum visit. She is 6 weeks postpartum following a spontaneous vaginal delivery. I have fully reviewed the prenatal and intrapartum course. The delivery was at 38 gestational weeks.  Anesthesia: epidural. Postpartum course has been well. Baby's course has been well. Baby is feeding by breast. Bleeding: patient has not resumed menses, with No LMP recorded. Bowel function is normal. Bladder function is normal. Patient is not sexually active. Contraception method desired is vasectomy. Postpartum depression screening: negative.  EDPS score is 0.    The following portions of the patient's history were reviewed and updated as appropriate: allergies, current medications, past family history, past medical history, past social history, past surgical history, and problem list.  Review of Systems Pertinent items noted in HPI and remainder of comprehensive ROS otherwise negative.   Objective:    BP 123/83    Pulse (!) 104    Ht 5\' 2"  (1.575 m)    Wt 154 lb 4.8 oz (70 kg)    Breastfeeding Yes    BMI 28.22 kg/m   General:  alert and no distress   Breasts:  inspection negative, no nipple discharge or bleeding, no masses or nodularity palpable  Lungs: clear to auscultation bilaterally  Heart:  regular rate and rhythm, S1, S2 normal, no murmur, click, rub or gallop  Abdomen: soft, non-tender; bowel sounds normal; no masses,  no organomegaly.     Vulva:  normal  Vagina: normal vagina, no discharge, exudate, lesion, or erythema  Cervix:  no cervical motion tenderness and no lesions  Corpus: normal size, contour, position, consistency, mobility, non-tender  Adnexa:  normal adnexa and no mass, fullness, tenderness  Rectal Exam: Not performed.         Edinburgh Postnatal Depression Scale Screening Tool 01/01/2021 11/19/2016  I have been able to laugh and see the funny side of  things. 0 0  I have looked forward with enjoyment to things. 0 0  I have blamed myself unnecessarily when things went wrong. 0 0  I have been anxious or worried for no good reason. 0 0  I have felt scared or panicky for no good reason. 0 0  Things have been getting on top of me. 0 1  I have been so unhappy that I have had difficulty sleeping. 0 0  I have felt sad or miserable. 0 0  I have been so unhappy that I have been crying. 0 0  The thought of harming myself has occurred to me. 0 0  Edinburgh Postnatal Depression Scale Total 0 1  Some encounter information is confidential and restricted. Go to Review Flowsheets activity to see all data.     Labs:  Lab Results  Component Value Date   HGB 11.3 (L) 12/14/2020    Assessment:   1. Postpartum care following vaginal delivery   2. Lactating mother      Plan:   1. Contraception: vasectomy 2. Follow up in: 6 months or as needed.    12/16/2020, MD [facility]

## 2021-02-01 ENCOUNTER — Encounter: Payer: Self-pay | Admitting: Obstetrics and Gynecology

## 2021-02-07 DIAGNOSIS — H10022 Other mucopurulent conjunctivitis, left eye: Secondary | ICD-10-CM | POA: Diagnosis not present

## 2021-03-05 ENCOUNTER — Encounter: Payer: BC Managed Care – PPO | Admitting: Certified Nurse Midwife

## 2021-04-19 DIAGNOSIS — M9905 Segmental and somatic dysfunction of pelvic region: Secondary | ICD-10-CM | POA: Diagnosis not present

## 2021-04-19 DIAGNOSIS — M9906 Segmental and somatic dysfunction of lower extremity: Secondary | ICD-10-CM | POA: Diagnosis not present

## 2021-04-19 DIAGNOSIS — M9901 Segmental and somatic dysfunction of cervical region: Secondary | ICD-10-CM | POA: Diagnosis not present

## 2021-04-19 DIAGNOSIS — M9903 Segmental and somatic dysfunction of lumbar region: Secondary | ICD-10-CM | POA: Diagnosis not present

## 2021-05-17 DIAGNOSIS — M9906 Segmental and somatic dysfunction of lower extremity: Secondary | ICD-10-CM | POA: Diagnosis not present

## 2021-05-17 DIAGNOSIS — M9905 Segmental and somatic dysfunction of pelvic region: Secondary | ICD-10-CM | POA: Diagnosis not present

## 2021-05-17 DIAGNOSIS — M9901 Segmental and somatic dysfunction of cervical region: Secondary | ICD-10-CM | POA: Diagnosis not present

## 2021-05-17 DIAGNOSIS — M9903 Segmental and somatic dysfunction of lumbar region: Secondary | ICD-10-CM | POA: Diagnosis not present

## 2021-05-28 HISTORY — PX: MELANOMA EXCISION: SHX5266

## 2021-05-28 NOTE — Progress Notes (Signed)
? ? ?GYNECOLOGY ANNUAL PHYSICAL EXAM PROGRESS NOTE ? ?Subjective:  ? ? Jenny Mcguire is a 36 y.o. [phone] female who presents for an annual exam. The patient has no complaints today. The patient is not currently sexually active. The patient participates in regular exercise: no. Has the patient ever been transfused or tattooed?: yes. The patient reports that there is not domestic violence in her life.  ? ?Notes she just returned to having cycles after having her baby. This first cycle was heavier than normal, but only lasted 4-5 days.  ? ?Menstrual History: ?Menarche age: 38 ?No LMP recorded. ?Period Duration (Days): 5 ?Period Pattern: Regular ?Menstrual Flow: Moderate ?Menstrual Control: Maxi pad, Tampon ?Menstrual Control Change Freq (Hours): 1-2 ?Dysmenorrhea: (!) Moderate ?Dysmenorrhea Symptoms: Cramping, Headache ? ? ?Gynecologic History:  ?Contraception:  condoms and vasectomy planned for next month  ?History of STI's: Denies ?Last Pap: 02/28/2020. Results were: normal.  Denies h/o abnormal pap smears. ? ? ? ?OB History  ?Gravida Para Term Preterm AB Living  ?6 3 3  0 3 3  ?SAB IAB Ectopic Multiple Live Births  ?3 0 0 0 3  ?  ?# Outcome Date GA Lbr Len/2nd Weight Sex Delivery Anes PTL Lv  ?6 Term 12/13/20 [redacted]w[redacted]d 00:45 / 00:19 7 lb 6.9 oz (3.37 kg) M Vag-Spont EPI  LIV  ?   Name: LYANA, ASBILL  ?   Apgar1: 8  Apgar5: 9  ?5 Term 09/29/16 [redacted]w[redacted]d / 00:12 7 lb 0.2 oz (3.18 kg) F Vag-Spont EPI  LIV  ?   Name: NASIA, CANNAN  ?   Apgar1: 8  Apgar5: 8  ?4 Term 2011    F Vag-Spont  N LIV  ?3 SAB           ?2 SAB           ?1 SAB           ? ? ?Past Medical History:  ?Diagnosis Date  ? Amenorrhea   ? History of UTI   ? ? ?Past Surgical History:  ?Procedure Laterality Date  ? BREAST ENHANCEMENT SURGERY    ? ? ?Family History  ?Problem Relation Age of Onset  ? COPD Mother   ? Cancer Neg Hx   ? Heart failure Neg Hx   ? Diabetes Neg Hx   ? ? ?Social History  ? ?Socioeconomic History  ? Marital status: Married  ?   Spouse name: [id number]  ? Number of children: Not on file  ? Years of education: Not on file  ? Highest education level: Not on file  ?Occupational History  ? Not on file  ?Tobacco Use  ? Smoking status: Never  ? Smokeless tobacco: Never  ?Vaping Use  ? Vaping Use: Never used  ?Substance and Sexual Activity  ? Alcohol use: Not Currently  ?  Comment: occas/ not during pregnancy  ? Drug use: No  ? Sexual activity: Not Currently  ?  Comment: vasectomy  ?Other Topics Concern  ? Not on file  ?Social History Narrative  ? Not on file  ? ?Social Determinants of Health  ? ?Financial Resource Strain: Not on file  ?Food Insecurity: Not on file  ?Transportation Needs: Not on file  ?Physical Activity: Not on file  ?Stress: Not on file  ?Social Connections: Not on file  ?Intimate Partner Violence: Not on file  ? ? ?Current Outpatient Medications on File Prior to Visit  ?Medication Sig Dispense Refill  ? ibuprofen (ADVIL) 600 MG tablet Take  1 tablet (600 mg total) by mouth every 6 (six) hours as needed. 60 tablet 0  ? prenatal vitamin w/FE, FA (PRENATAL 1 + 1) 27-1 MG TABS tablet Take 1 tablet by mouth daily at 12 noon.    ? Probiotic Product (PROBIOTIC ADVANCED PO) Take by mouth.    ? ?No current facility-administered medications on file prior to visit.  ? ? ?Allergies  ?Allergen Reactions  ? Sulfa Antibiotics   ? ? ? ?Review of Systems ?Constitutional: negative for chills, fatigue, fevers and sweats ?Eyes: negative for irritation, redness and visual disturbance ?Ears, nose, mouth, throat, and face: negative for hearing loss, nasal congestion, snoring and tinnitus ?Respiratory: negative for asthma, cough, sputum ?Cardiovascular: negative for chest pain, dyspnea, exertional chest pressure/discomfort, irregular heart beat, palpitations and syncope ?Gastrointestinal: negative for abdominal pain, change in bowel habits, nausea and vomiting ?Genitourinary: negative for abnormal menstrual periods, genital lesions, sexual problems and  vaginal discharge, dysuria and urinary incontinence ?Integument/breast: negative for breast lump, breast tenderness and nipple discharge ?Hematologic/lymphatic: negative for bleeding and easy bruising ?Musculoskeletal:negative for back pain and muscle weakness ?Neurological: negative for dizziness, headaches, vertigo and weakness ?Endocrine: negative for diabetic symptoms including polydipsia, polyuria and skin dryness ?Allergic/Immunologic: negative for hay fever and urticaria    ? ? ?Objective:  ?Blood pressure 114/78, pulse 94, resp. rate 16, height 5\' 2"  (1.575 m), weight 146 lb 6.4 oz (66.4 kg), currently breastfeeding.  Body mass index is 26.78 kg/m?. ? ?  ?General Appearance:    Alert, cooperative, no distress, appears stated age  ?Head:    Normocephalic, without obvious abnormality, atraumatic  ?Eyes:    PERRL, conjunctiva/corneas clear, EOM's intact, both eyes  ?Ears:    Normal external ear canals, both ears  ?Nose:   Nares normal, septum midline, mucosa normal, no drainage or sinus tenderness  ?Throat:   Lips, mucosa, and tongue normal; teeth and gums normal  ?Neck:   Supple, symmetrical, trachea midline, no adenopathy; thyroid: no enlargement/tenderness/nodules; no carotid bruit or JVD  ?Back:     Symmetric, no curvature, ROM normal, no CVA tenderness  ?Lungs:     Clear to auscultation bilaterally, respirations unlabored  ?Chest Wall:    No tenderness or deformity  ? Heart:    Regular rate and rhythm, S1 and S2 normal, no murmur, rub or gallop  ?Breast Exam:    No tenderness, masses, or nipple abnormality  ?Abdomen:     Soft, non-tender, bowel sounds active all four quadrants, no masses, no organomegaly.    ?Genitalia:    Pelvic:external genitalia normal, vagina without lesions, discharge, or tenderness, rectovaginal septum  normal. Cervix normal in appearance, no cervical motion tenderness, no adnexal masses or tenderness.  Uterus normal size, shape, mobile, regular contours, nontender.  ?Rectal:     Normal external sphincter.  No hemorrhoids appreciated. Internal exam not done.   ?Extremities:   Extremities normal, atraumatic, no cyanosis or edema  ?Pulses:   2+ and symmetric all extremities  ?Skin:   Skin color, texture, turgor normal, no rashes or lesions  ?Lymph nodes:   Cervical, supraclavicular, and axillary nodes normal  ?Neurologic:   CNII-XII intact, normal strength, sensation and reflexes throughout  ? ? ?Labs:  ?Lab Results  ?Component Value Date  ? WBC 9.1 12/14/2020  ? HGB 11.3 (L) 12/14/2020  ? HCT 32.7 (L) 12/14/2020  ? MCV 97.6 12/14/2020  ? PLT 134 (L) 12/14/2020  ? ? ?Lab Results  ?Component Value Date  ? CREATININE 0.66 05/17/2019  ?  BUN 12 05/17/2019  ? NA 138 05/17/2019  ? K 3.6 05/17/2019  ? CL 104 05/17/2019  ? CO2 27 05/17/2019  ? ? ?No results found for: ALT, AST, GGT, ALKPHOS, BILITOT ? ?Lab Results  ?Component Value Date  ? TSH 2.410 01/20/2020  ? ? ? ?Assessment:  ? ?1. Encounter for well woman exam with routine gynecological exam   ?2. Screening cholesterol level   ? ?  ?Plan:  ?Blood tests: CBC with diff, Comprehensive metabolic panel, Lipoproteins. ?Breast self exam technique reviewed and patient encouraged to perform self-exam monthly. ?Contraception: vasectomy planned. Using condoms during interim.  ?Discussed healthy lifestyle modifications. ?Mammogram  Not age appropriate ?Pap smear  UTD . ?Continue to monitor cycles. If they continue to be heavy, can follow up for management.  ?Follow up in 1 year for annual exam ? ? ?Hildred Laser, MD ?[facility] ? ?

## 2021-05-29 ENCOUNTER — Encounter: Payer: Self-pay | Admitting: Obstetrics and Gynecology

## 2021-05-29 ENCOUNTER — Ambulatory Visit (INDEPENDENT_AMBULATORY_CARE_PROVIDER_SITE_OTHER): Payer: BC Managed Care – PPO | Admitting: Obstetrics and Gynecology

## 2021-05-29 VITALS — BP 114/78 | HR 94 | Resp 16 | Ht 62.0 in | Wt 146.4 lb

## 2021-05-29 DIAGNOSIS — H1032 Unspecified acute conjunctivitis, left eye: Secondary | ICD-10-CM | POA: Diagnosis not present

## 2021-05-29 DIAGNOSIS — Z13220 Encounter for screening for lipoid disorders: Secondary | ICD-10-CM

## 2021-05-29 DIAGNOSIS — Z01419 Encounter for gynecological examination (general) (routine) without abnormal findings: Secondary | ICD-10-CM | POA: Diagnosis not present

## 2021-05-29 NOTE — Patient Instructions (Signed)
Preventive Care 21-36 Years Old, Female ?Preventive care refers to lifestyle choices and visits with your health care provider that can promote health and wellness. Preventive care visits are also called wellness exams. ?What can I expect for my preventive care visit? ?Counseling ?During your preventive care visit, your health care provider may ask about your: ?Medical history, including: ?Past medical problems. ?Family medical history. ?Pregnancy history. ?Current health, including: ?Menstrual cycle. ?Method of birth control. ?Emotional well-being. ?Home life and relationship well-being. ?Sexual activity and sexual health. ?Lifestyle, including: ?Alcohol, nicotine or tobacco, and drug use. ?Access to firearms. ?Diet, exercise, and sleep habits. ?Work and work environment. ?Sunscreen use. ?Safety issues such as seatbelt and bike helmet use. ?Physical exam ?Your health care provider may check your: ?Height and weight. These may be used to calculate your BMI (body mass index). BMI is a measurement that tells if you are at a healthy weight. ?Waist circumference. This measures the distance around your waistline. This measurement also tells if you are at a healthy weight and may help predict your risk of certain diseases, such as type 2 diabetes and high blood pressure. ?Heart rate and blood pressure. ?Body temperature. ?Skin for abnormal spots. ?What immunizations do I need? ? ?Vaccines are usually given at various ages, according to a schedule. Your health care provider will recommend vaccines for you based on your age, medical history, and lifestyle or other factors, such as travel or where you work. ?What tests do I need? ?Screening ?Your health care provider may recommend screening tests for certain conditions. This may include: ?Pelvic exam and Pap test. ?Lipid and cholesterol levels. ?Diabetes screening. This is done by checking your blood sugar (glucose) after you have not eaten for a while (fasting). ?Hepatitis  B test. ?Hepatitis C test. ?HIV (human immunodeficiency virus) test. ?STI (sexually transmitted infection) testing, if you are at risk. ?BRCA-related cancer screening. This may be done if you have a family history of breast, ovarian, tubal, or peritoneal cancers. ?Talk with your health care provider about your test results, treatment options, and if necessary, the need for more tests. ?Follow these instructions at home: ?Eating and drinking ? ?Eat a healthy diet that includes fresh fruits and vegetables, whole grains, lean protein, and low-fat dairy products. ?Take vitamin and mineral supplements as recommended by your health care provider. ?Do not drink alcohol if: ?Your health care provider tells you not to drink. ?You are pregnant, may be pregnant, or are planning to become pregnant. ?If you drink alcohol: ?Limit how much you have to 0-1 drink a day. ?Know how much alcohol is in your drink. In the [place] one drink equals one 12 oz bottle of beer (355 mL), one 5 oz glass of wine (148 mL), or one 1? oz glass of hard liquor (44 mL). ?Lifestyle ?Brush your teeth every morning and night with fluoride toothpaste. Floss one time each day. ?Exercise for at least 30 minutes 5 or more days each week. ?Do not use any products that contain nicotine or tobacco. These products include cigarettes, chewing tobacco, and vaping devices, such as e-cigarettes. If you need help quitting, ask your health care provider. ?Do not use drugs. ?If you are sexually active, practice safe sex. Use a condom or other form of protection to prevent STIs. ?If you do not wish to become pregnant, use a form of birth control. If you plan to become pregnant, see your health care provider for a prepregnancy visit. ?Find healthy ways to manage stress, such as: ?Meditation,   yoga, or listening to music. ?Journaling. ?Talking to a trusted person. ?Spending time with friends and family. ?Minimize exposure to UV radiation to reduce your risk of skin  cancer. ?Safety ?Always wear your seat belt while driving or riding in a vehicle. ?Do not drive: ?If you have been drinking alcohol. Do not ride with someone who has been drinking. ?If you have been using any mind-altering substances or drugs. ?While texting. ?When you are tired or distracted. ?Wear a helmet and other protective equipment during sports activities. ?If you have firearms in your house, make sure you follow all gun safety procedures. ?Seek help if you have been physically or sexually abused. ?What's next? ?Go to your health care provider once a year for an annual wellness visit. ?Ask your health care provider how often you should have your eyes and teeth checked. ?Stay up to date on all vaccines. ?This information is not intended to replace advice given to you by your health care provider. Make sure you discuss any questions you have with your health care provider. ?Document Revised: 07/12/2020 Document Reviewed: 07/12/2020 ?Elsevier Patient Education ? [place]. ? ?

## 2021-05-30 ENCOUNTER — Encounter: Payer: Self-pay | Admitting: Obstetrics and Gynecology

## 2021-05-30 LAB — COMPREHENSIVE METABOLIC PANEL
ALT: 15 IU/L (ref 0–32)
AST: 15 IU/L (ref 0–40)
Albumin/Globulin Ratio: 2.3 — ABNORMAL HIGH (ref 1.2–2.2)
Albumin: 4.8 g/dL (ref 3.8–4.8)
Alkaline Phosphatase: 109 IU/L (ref 44–121)
BUN/Creatinine Ratio: 15 (ref 9–23)
BUN: 10 mg/dL (ref 6–20)
Bilirubin Total: 0.6 mg/dL (ref 0.0–1.2)
CO2: 26 mmol/L (ref 20–29)
Calcium: 9.2 mg/dL (ref 8.7–10.2)
Chloride: 103 mmol/L (ref 96–106)
Creatinine, Ser: 0.68 mg/dL (ref 0.57–1.00)
Globulin, Total: 2.1 g/dL (ref 1.5–4.5)
Glucose: 79 mg/dL (ref 70–99)
Potassium: 3.8 mmol/L (ref 3.5–5.2)
Sodium: 143 mmol/L (ref 134–144)
Total Protein: 6.9 g/dL (ref 6.0–8.5)
eGFR: 116 mL/min/{1.73_m2} (ref >59)

## 2021-05-30 LAB — CBC
Hematocrit: 41.3 % (ref 34.0–46.6)
Hemoglobin: 14.4 g/dL (ref 11.1–15.9)
MCH: 32.8 pg (ref 26.6–33.0)
MCHC: 34.9 g/dL (ref 31.5–35.7)
MCV: 94 fL (ref 79–97)
Platelets: 236 10*3/uL (ref 150–450)
RBC: 4.39 x10E6/uL (ref 3.77–5.28)
RDW: 12.8 % (ref 11.7–15.4)
WBC: 3.8 10*3/uL (ref 3.4–10.8)

## 2021-05-30 LAB — LIPID PANEL
Chol/HDL Ratio: 2.8 ratio (ref 0.0–4.4)
Cholesterol, Total: 227 mg/dL — ABNORMAL HIGH (ref 100–199)
HDL: 81 mg/dL (ref >39)
LDL Chol Calc (NIH): 139 mg/dL — ABNORMAL HIGH (ref 0–99)
Triglycerides: 43 mg/dL (ref 0–149)
VLDL Cholesterol Cal: 7 mg/dL (ref 5–40)

## 2021-06-04 DIAGNOSIS — D485 Neoplasm of uncertain behavior of skin: Secondary | ICD-10-CM | POA: Diagnosis not present

## 2021-06-04 DIAGNOSIS — D2272 Melanocytic nevi of left lower limb, including hip: Secondary | ICD-10-CM | POA: Diagnosis not present

## 2021-06-04 DIAGNOSIS — C4359 Malignant melanoma of other part of trunk: Secondary | ICD-10-CM | POA: Diagnosis not present

## 2021-06-04 DIAGNOSIS — D2262 Melanocytic nevi of left upper limb, including shoulder: Secondary | ICD-10-CM | POA: Diagnosis not present

## 2021-06-04 DIAGNOSIS — R208 Other disturbances of skin sensation: Secondary | ICD-10-CM | POA: Diagnosis not present

## 2021-06-04 DIAGNOSIS — D2261 Melanocytic nevi of right upper limb, including shoulder: Secondary | ICD-10-CM | POA: Diagnosis not present

## 2021-06-04 DIAGNOSIS — D225 Melanocytic nevi of trunk: Secondary | ICD-10-CM | POA: Diagnosis not present

## 2021-06-09 IMAGING — US US OB < 14 WEEKS - US OB TV
1 series · 14 of 28 positions shown · non-contrast
Comparison: Pelvic ultrasound 03/02/2019

CLINICAL DATA: Dating, viability

EXAM:
OBSTETRIC <14 WK US AND TRANSVAGINAL OB US
TECHNIQUE: Both transabdominal and transvaginal ultrasound examinations were
performed for complete evaluation of the gestation as well as the
maternal uterus, adnexal regions, and pelvic cul-de-sac.
Transvaginal technique was performed to assess early pregnancy.

[Series 1: us ob less than 14 weeks with ob transvaginal · 14 of 72 slices shown]
[im 3/72]
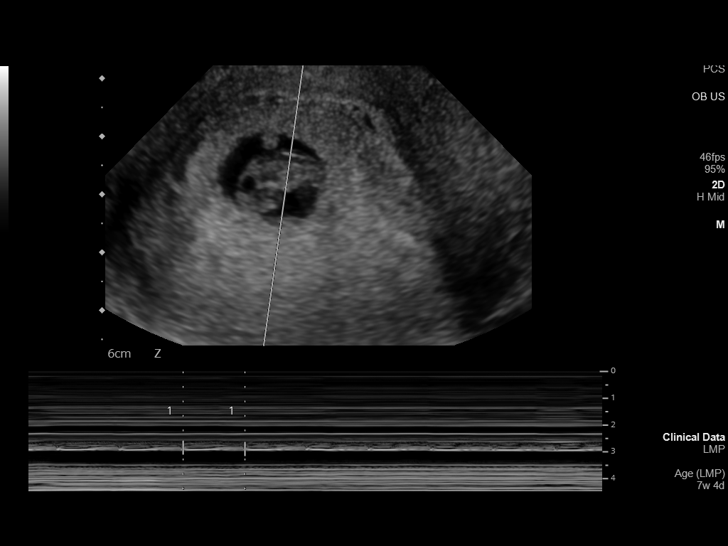
[im 8/72]
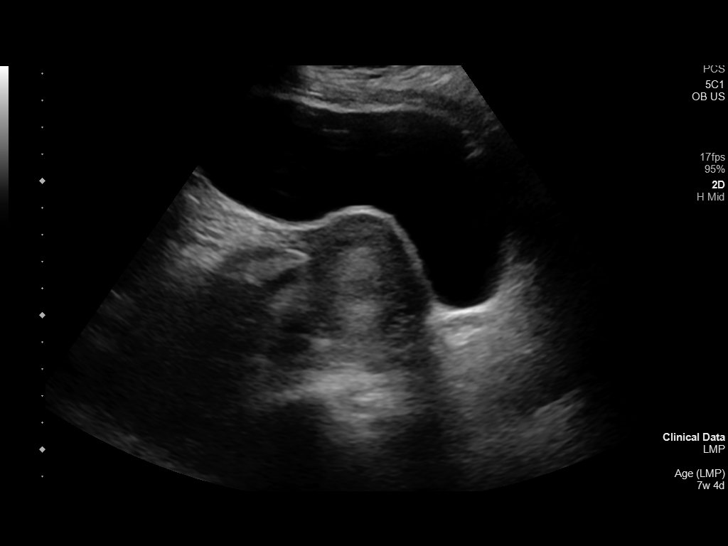
[im 14/72]
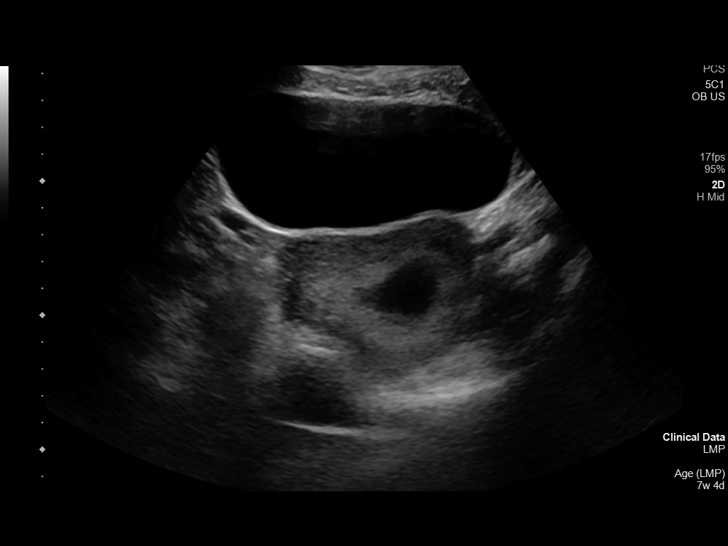
[im 19/72]
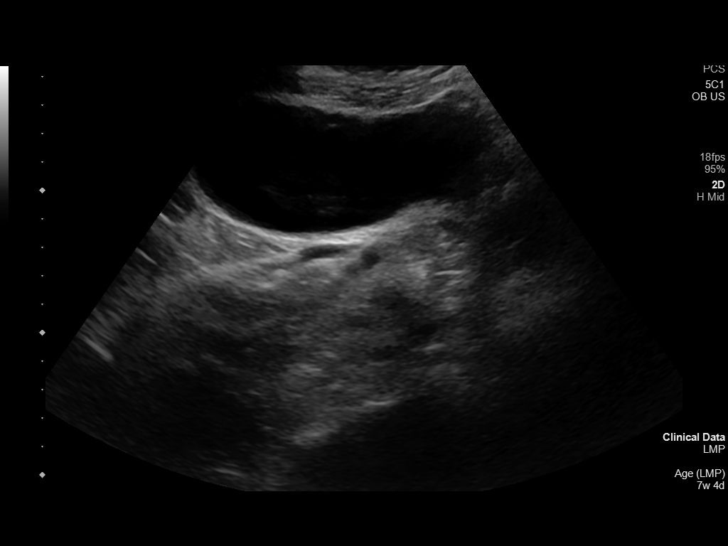
[im 24/72]
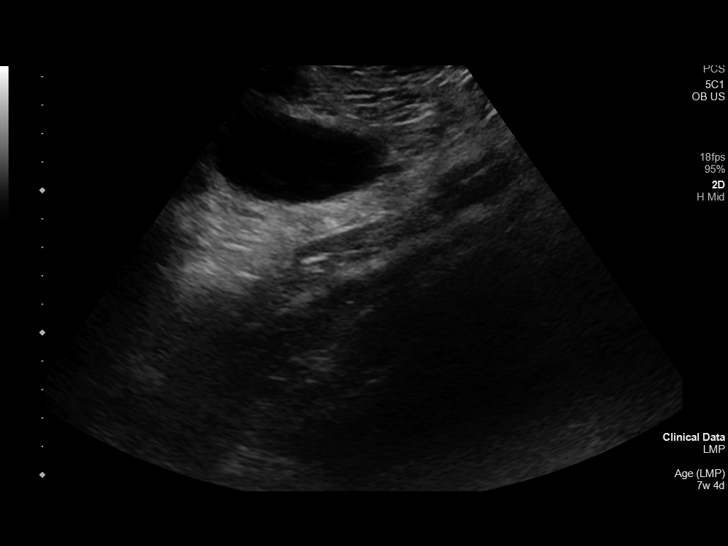
[im 29/72]
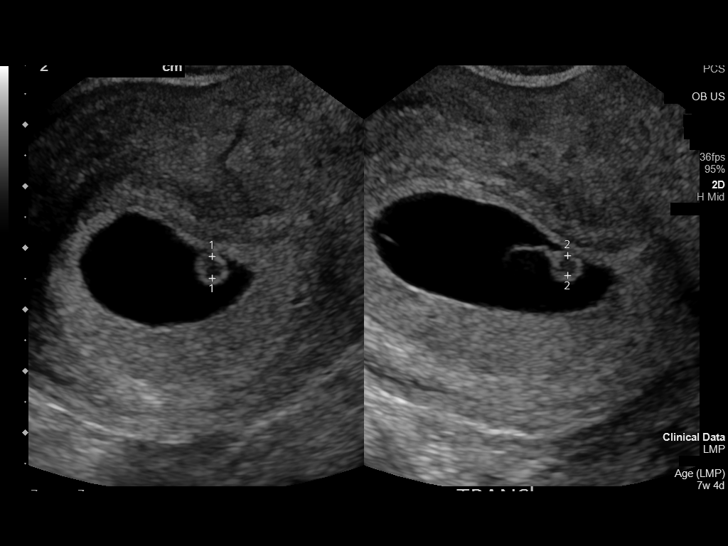
[im 35/72]
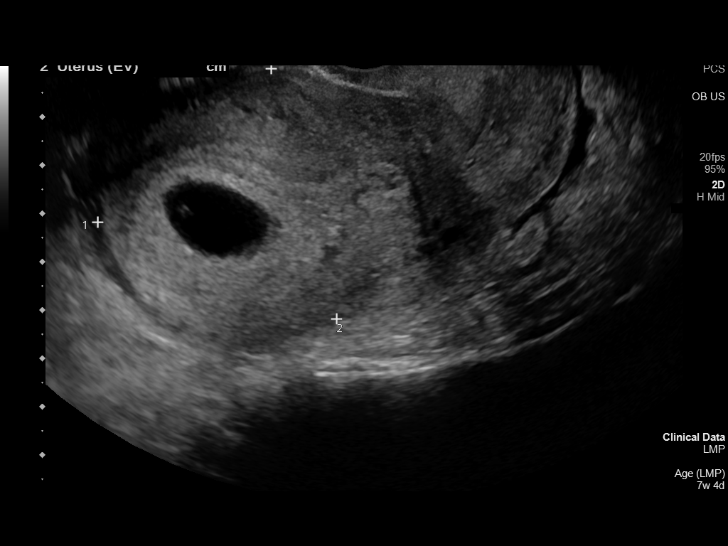
[im 40/72]
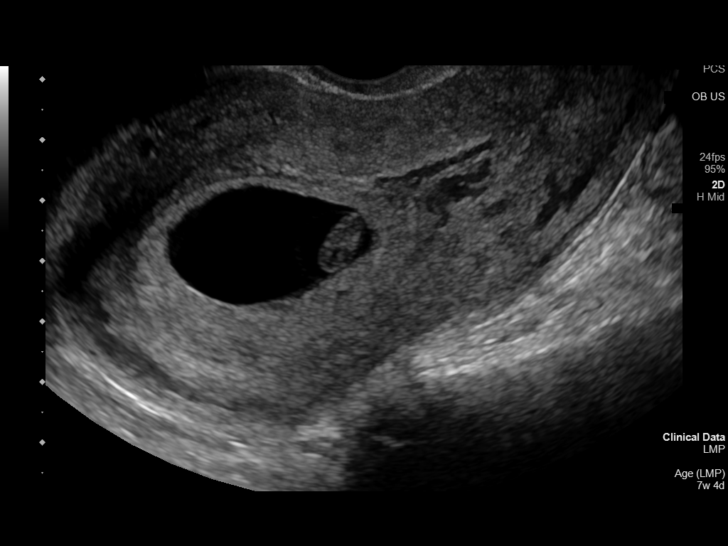
[im 45/72]
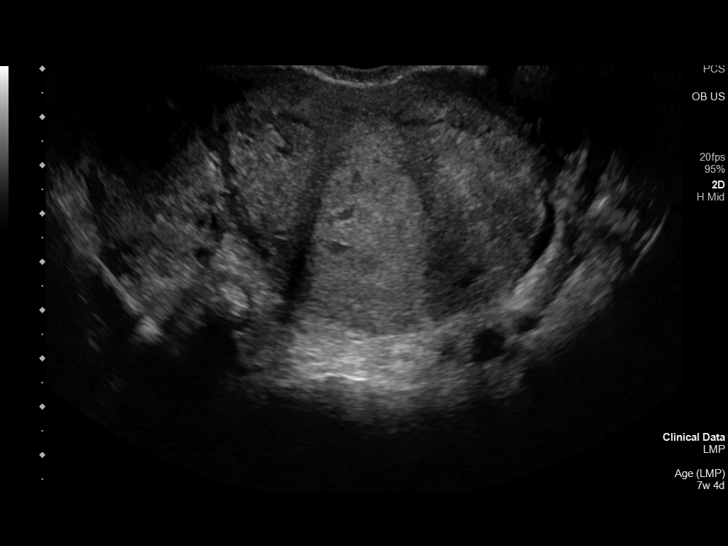
[im 50/72]
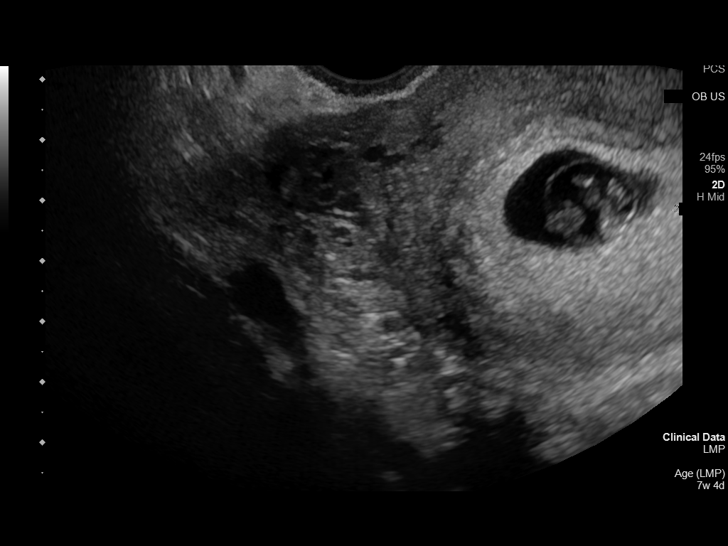
[im 56/72]
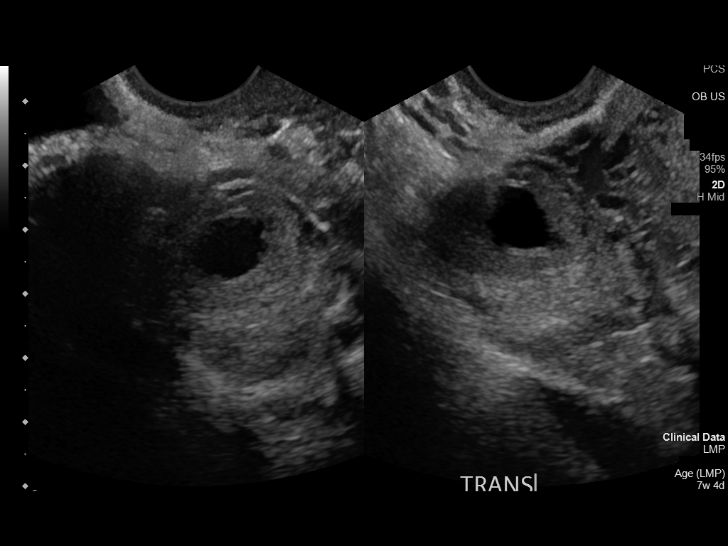
[im 61/72]
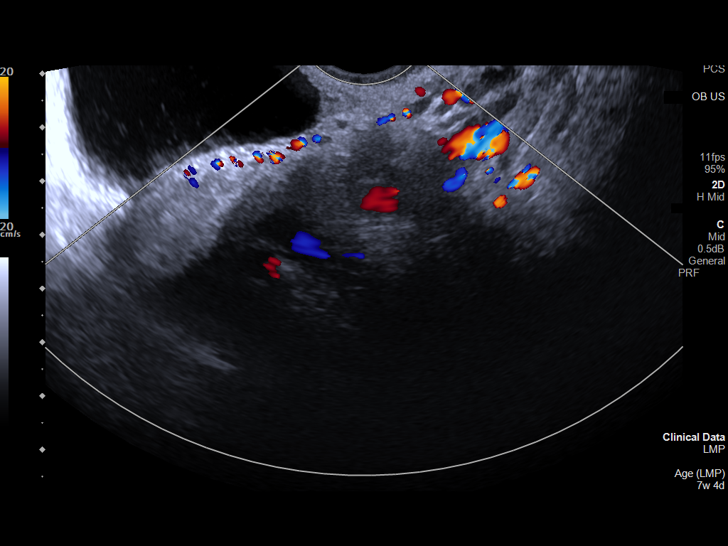
[im 66/72]
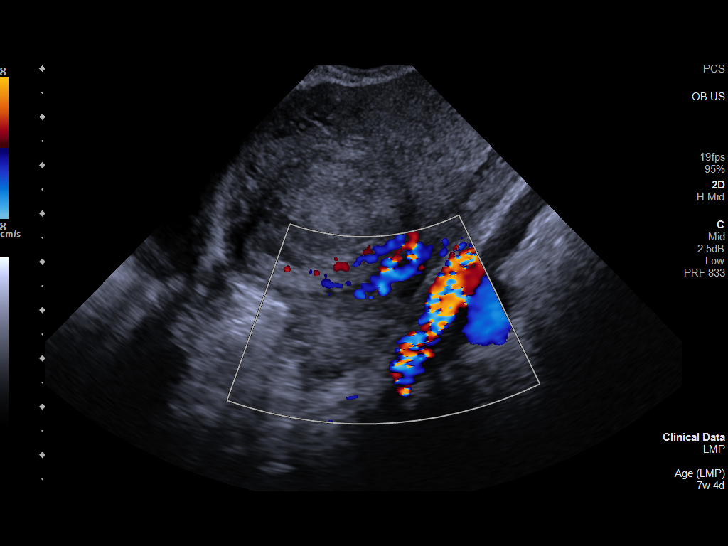
[im 72/72]
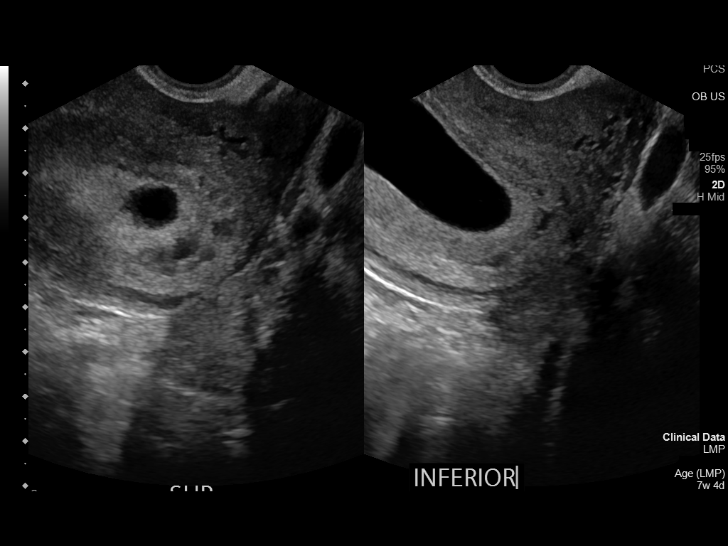

[14 of 28 positions shown; findings below may reference images not displayed]

FINDINGS: Intrauterine gestational sac: Single

Yolk sac:  Visualized.

Embryo:  Visualized.

Cardiac Activity: Visualized.

Heart Rate: 176 bpm

CRL:  16 mm   8 w   0 d                  US EDC: 12/22/2020

Subchorionic hemorrhage:  None visualized.

Maternal uterus/adnexae: Anteverted maternal uterus. No worrisome
uterine abnormalities. Probable corpus luteum in the right ovary. No
concerning adnexal lesions. No free fluid.
IMPRESSION: Single viable intrauterine gestation at 8 weeks, 0 days by
crown-rump length sonographic estimation.

No acute sonographic complication.

## 2021-06-11 DIAGNOSIS — C4359 Malignant melanoma of other part of trunk: Secondary | ICD-10-CM | POA: Diagnosis not present

## 2021-06-11 DIAGNOSIS — D0359 Melanoma in situ of other part of trunk: Secondary | ICD-10-CM | POA: Diagnosis not present

## 2021-08-16 IMAGING — US US OB COMP +14 WK
1 series · 13 of 28 positions shown · non-contrast
Comparison: none

CLINICAL DATA: 34-year-old gravida 3 para 2.  Scan for anatomy.

EXAM:
OBSTETRICAL ULTRASOUND >14 WKS

[Series 1: us ob comp +14 wk · 13 of 82 slices shown]
[im 4/82]
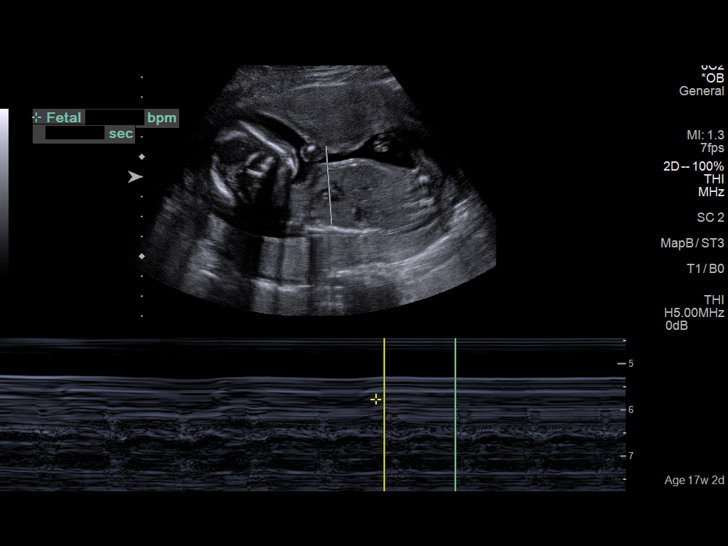
[im 10/82]
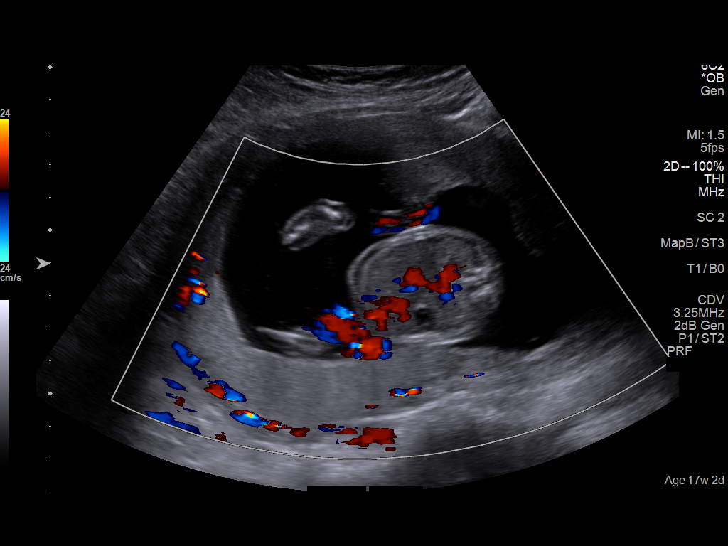
[im 16/82]
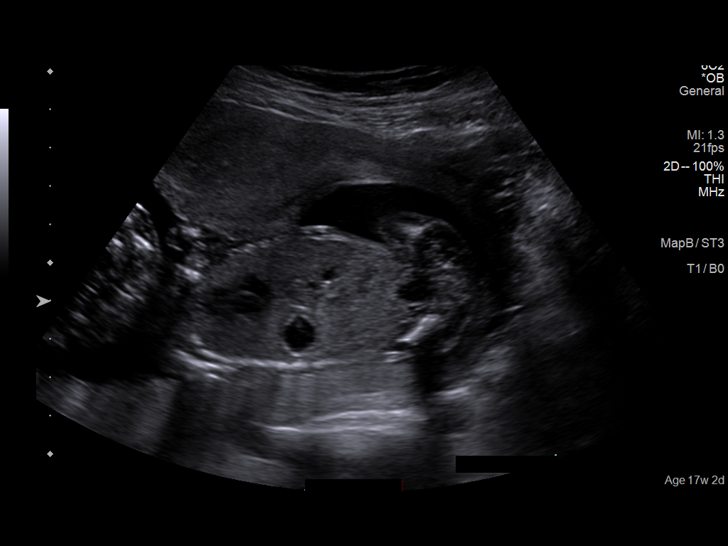
[im 22/82]
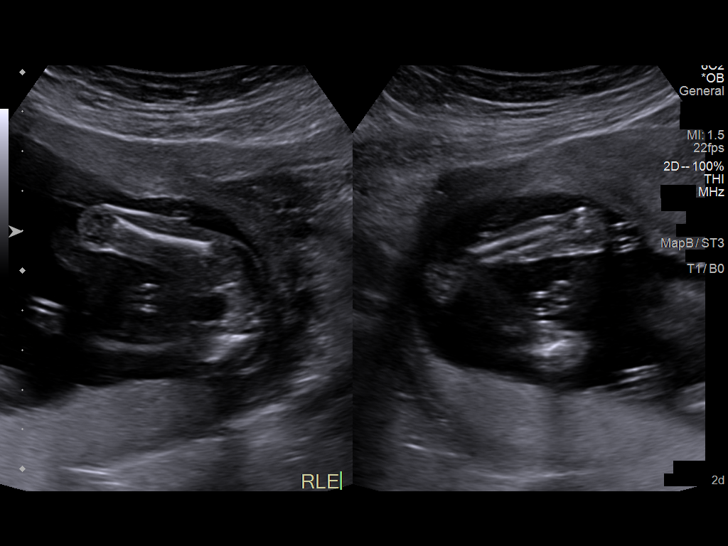
[im 28/82]
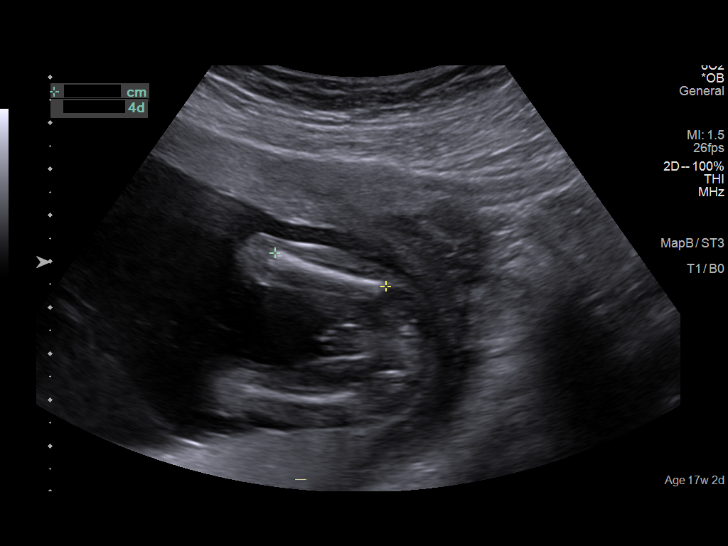
[im 34/82]
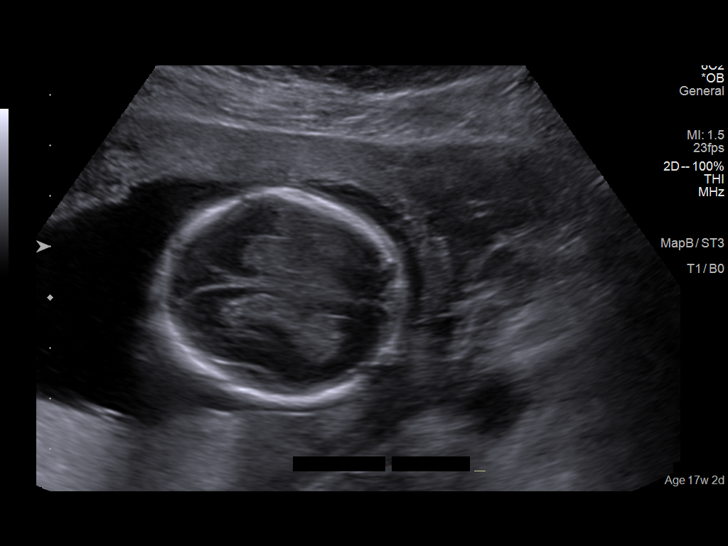
[im 43/82]
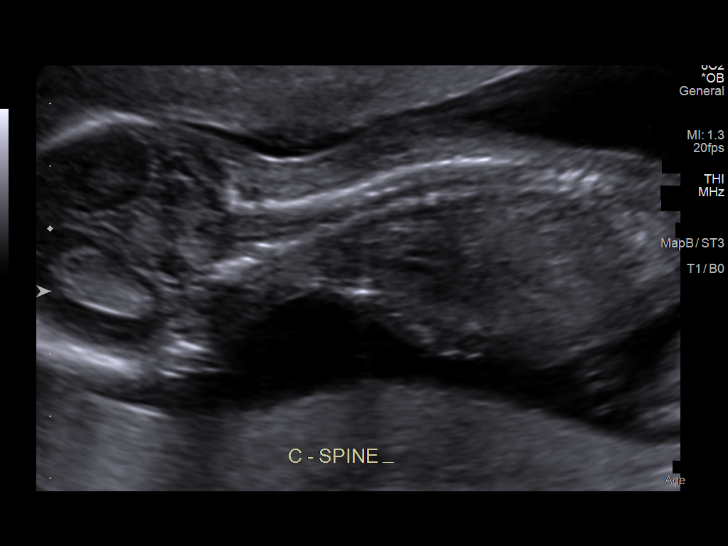
[im 49/82]
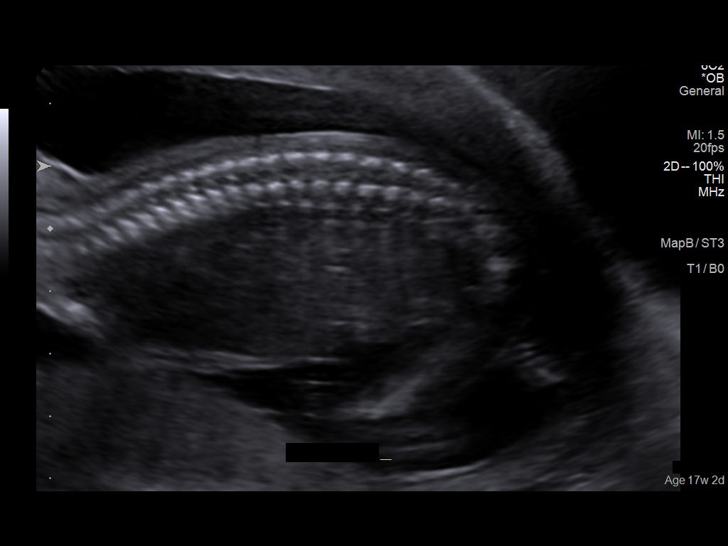
[im 55/82]
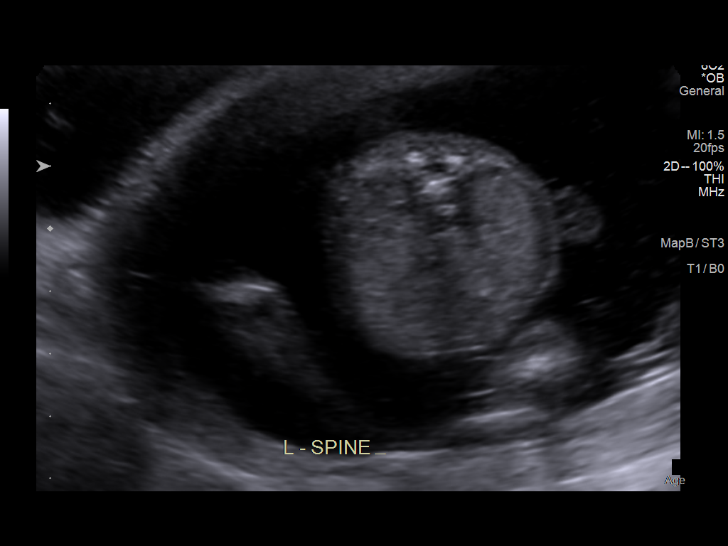
[im 61/82]
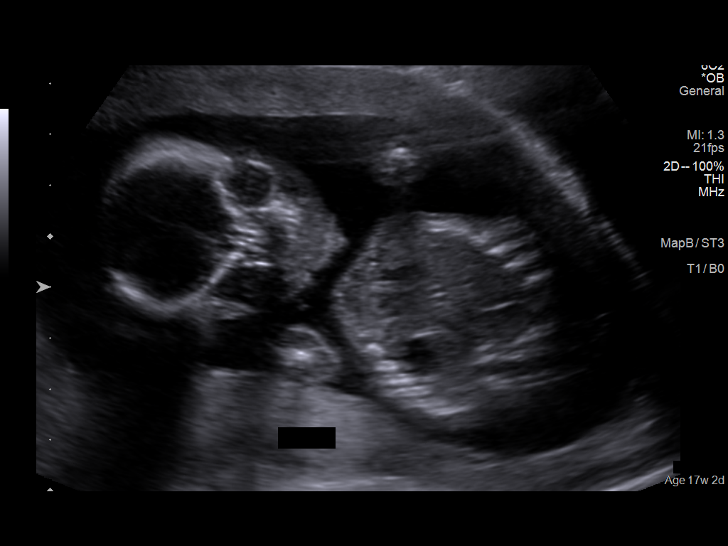
[im 67/82]
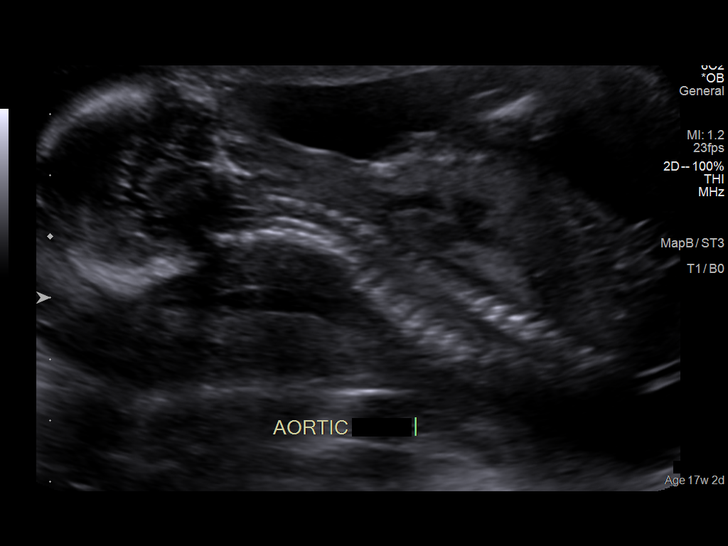
[im 73/82]
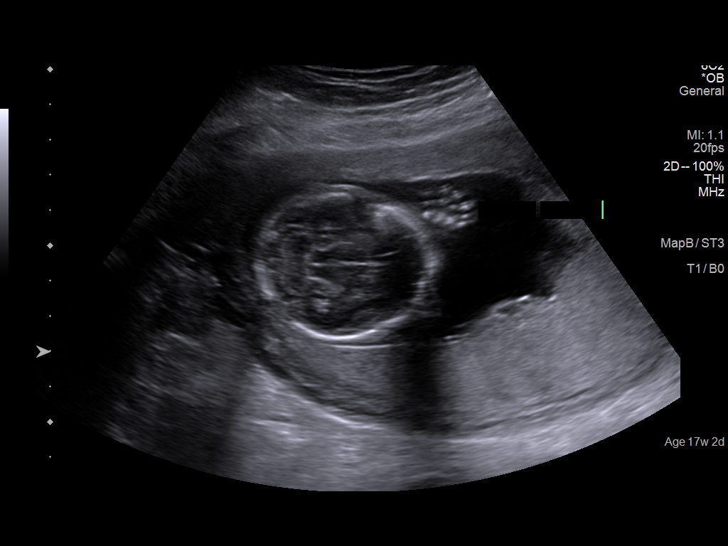
[im 79/82]
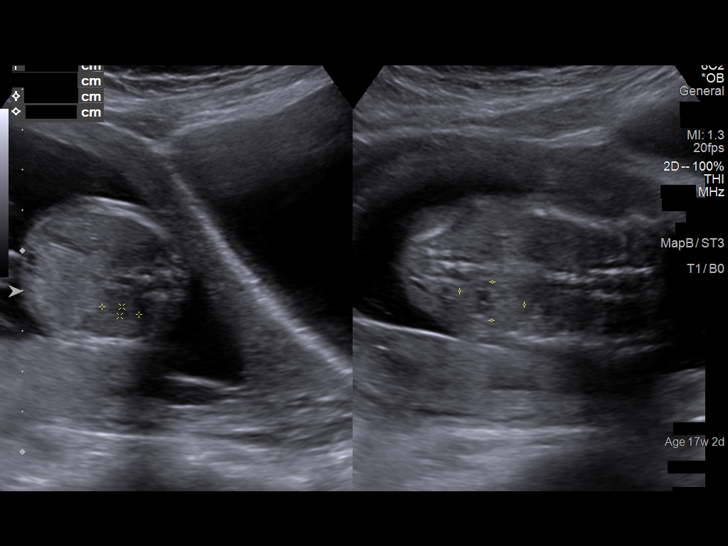

[13 of 28 positions shown; findings below may reference images not displayed]

FINDINGS: Number of Fetuses: 1

Heart Rate:  144 bpm

Movement: Present

Presentation: Variable

Previa: None

Placental Location: Posterior

Amniotic Fluid (Subjective): Normal

Amniotic Fluid (Objective):

Vertical pocket = 3.4cm

FETAL BIOMETRY

BPD: 4.17cm 18w 4d

HC:   14.66cm 17w 6d

AC:   12.12cm 17w 6d

FL:   2.55cm 17w 5d

Current Mean GA: 18w 0d US EDC: 12/20/2020

Assigned GA: 17w 2d Assigned EDC: 12/25/2020 (by LMP and confirmed
by ultrasound)

FETAL ANATOMY

Lateral Ventricles: Appears normal

Thalami/CSP: Appears normal

Posterior Fossa:  Appears normal

Nuchal Region: Appears normal   NFT= 3 millimeters

Upper Lip: Appears normal

Spine: Appears normal

4 Chamber Heart on Left: Not visualized

LVOT: Not visualized

RVOT: Not visualized

Stomach on Left: Appears normal

3 Vessel Cord: Appears normal

Cord Insertion site: Appears normal

Kidneys: Limited views appear normal

Bladder: Appears normal

Extremities: Appears normal

Sex: Male

Technically difficult due to: Fetal position and gestational age

Maternal Findings:

Cervix:  4.4 centimeters on transabdominal evaluation
IMPRESSION: 1. Single living intrauterine fetus in variable presentation.
2. Amniotic fluid volume within normal limits.
3. Size and dates correlate within 5 days.
4. Limited anatomic survey is normal. Views of the heart could not
be obtained. Views of the kidneys are limited. Consider follow-up.

## 2021-09-11 DIAGNOSIS — D225 Melanocytic nevi of trunk: Secondary | ICD-10-CM | POA: Diagnosis not present

## 2021-09-11 DIAGNOSIS — D2261 Melanocytic nevi of right upper limb, including shoulder: Secondary | ICD-10-CM | POA: Diagnosis not present

## 2021-09-11 DIAGNOSIS — Z85820 Personal history of malignant melanoma of skin: Secondary | ICD-10-CM | POA: Diagnosis not present

## 2021-09-11 DIAGNOSIS — D2272 Melanocytic nevi of left lower limb, including hip: Secondary | ICD-10-CM | POA: Diagnosis not present

## 2021-11-07 ENCOUNTER — Encounter: Payer: Self-pay | Admitting: Obstetrics and Gynecology

## 2021-11-07 ENCOUNTER — Ambulatory Visit (INDEPENDENT_AMBULATORY_CARE_PROVIDER_SITE_OTHER): Payer: BC Managed Care – PPO

## 2021-11-07 VITALS — BP 112/80 | HR 118 | Ht 62.0 in | Wt 141.0 lb

## 2021-11-07 DIAGNOSIS — R300 Dysuria: Secondary | ICD-10-CM

## 2021-11-07 LAB — POCT URINALYSIS DIPSTICK
Blood, UA: POSITIVE
Glucose, UA: NEGATIVE
Nitrite, UA: NEGATIVE
Protein, UA: NEGATIVE
Spec Grav, UA: 1.01 (ref 1.010–1.025)
Urobilinogen, UA: 1 E.U./dL
pH, UA: 6.5 (ref 5.0–8.0)

## 2021-11-07 MED ORDER — NITROFURANTOIN MONOHYD MACRO 100 MG PO CAPS: 100.0000 mg | ORAL_CAPSULE | Freq: Two times a day (BID) | ORAL | 0 refills | Status: DC

## 2021-11-07 NOTE — Progress Notes (Signed)
Pt reports uti symptoms, dysuria, back pain, frequency and pressure also a headache. Reports breast feeding and took Macrobid in the past

## 2021-11-10 ENCOUNTER — Other Ambulatory Visit: Payer: Self-pay | Admitting: Obstetrics and Gynecology

## 2021-11-10 LAB — URINE CULTURE

## 2022-02-26 DIAGNOSIS — D2262 Melanocytic nevi of left upper limb, including shoulder: Secondary | ICD-10-CM | POA: Diagnosis not present

## 2022-02-26 DIAGNOSIS — D485 Neoplasm of uncertain behavior of skin: Secondary | ICD-10-CM | POA: Diagnosis not present

## 2022-02-26 DIAGNOSIS — D225 Melanocytic nevi of trunk: Secondary | ICD-10-CM | POA: Diagnosis not present

## 2022-02-26 DIAGNOSIS — D2271 Melanocytic nevi of right lower limb, including hip: Secondary | ICD-10-CM | POA: Diagnosis not present

## 2022-02-26 DIAGNOSIS — D2261 Melanocytic nevi of right upper limb, including shoulder: Secondary | ICD-10-CM | POA: Diagnosis not present

## 2022-03-12 DIAGNOSIS — Z13220 Encounter for screening for lipoid disorders: Secondary | ICD-10-CM | POA: Diagnosis not present

## 2022-03-12 DIAGNOSIS — Z0000 Encounter for general adult medical examination without abnormal findings: Secondary | ICD-10-CM | POA: Diagnosis not present

## 2022-03-12 DIAGNOSIS — Z1389 Encounter for screening for other disorder: Secondary | ICD-10-CM | POA: Diagnosis not present

## 2022-03-12 DIAGNOSIS — Z131 Encounter for screening for diabetes mellitus: Secondary | ICD-10-CM | POA: Diagnosis not present

## 2022-04-16 DIAGNOSIS — L905 Scar conditions and fibrosis of skin: Secondary | ICD-10-CM | POA: Diagnosis not present

## 2022-04-16 DIAGNOSIS — D225 Melanocytic nevi of trunk: Secondary | ICD-10-CM | POA: Diagnosis not present

## 2022-06-05 NOTE — Progress Notes (Signed)
GYNECOLOGY ANNUAL PHYSICAL EXAM PROGRESS NOTE  Subjective:    Jenny Mcguire is a 37 y.o. [id number] female who presents for an annual exam. The patient has no complaints today. The patient is sexually active. The patient participates in regular exercise: no. Has the patient ever been transfused or tattooed?: yes. The patient reports that there is not domestic violence in her life.   Reports periods have become a little more irregular recently as her infant has picked up on more frequent feedings from the breast.   Menstrual History: Menarche age: 40 Patient's last menstrual period was 05/23/2022 (exact date). Period Cycle (Days): 28 Period Duration (Days): 5 Period Pattern: Regular Menstrual Flow: Heavy, Light Menstrual Control: Maxi pad Menstrual Control Change Freq (Hours): 1-2 Dysmenorrhea: (!) Mild Dysmenorrhea Symptoms: Cramping     Gynecologic History:  Contraception: vasectomy History of STI's: Denies Last Pap: 02/28/2020. Results were: normal.  Denies h/o abnormal pap smears. Last mammogram: Not age appropriate   OB History  Gravida Para Term Preterm AB Living  6 3 3  0 3 3  SAB IAB Ectopic Multiple Live Births  3 0 0 0 3    # Outcome Date GA Lbr Len/2nd Weight Sex Delivery Anes PTL Lv  6 Term 12/13/20 [redacted]w[redacted]d 00:45 / 00:19 7 lb 6.9 oz (3.37 kg) M Vag-Spont EPI  LIV     Name: Stringfellow,BOY Lagretta     Apgar1: 8  Apgar5: 9  5 Term 09/29/16 [redacted]w[redacted]d / 00:12 7 lb 0.2 oz (3.18 kg) F Vag-Spont EPI  LIV     Name: Oliveira,GIRL Gwyneth     Apgar1: 8  Apgar5: 8  4 Term 2011    F Vag-Spont  N LIV  3 SAB           2 SAB           1 SAB             Past Medical History:  Diagnosis Date   Amenorrhea    History of UTI     Past Surgical History:  Procedure Laterality Date   BREAST ENHANCEMENT SURGERY      Family History  Problem Relation Age of Onset   COPD Mother    Cancer Neg Hx    Heart failure Neg Hx    Diabetes Neg Hx     Social History   Socioeconomic  History   Marital status: Married    Spouse name: Harrold Donath   Number of children: Not on file   Years of education: Not on file   Highest education level: Not on file  Occupational History   Not on file  Tobacco Use   Smoking status: Never   Smokeless tobacco: Never  Vaping Use   Vaping Use: Never used  Substance and Sexual Activity   Alcohol use: Not Currently    Comment: occas/ not during pregnancy   Drug use: No   Sexual activity: Not Currently    Comment: vasectomy  Other Topics Concern   Not on file  Social History Narrative   Not on file   Social Determinants of Health   Financial Resource Strain: Not on file  Food Insecurity: Not on file  Transportation Needs: Not on file  Physical Activity: Not on file  Stress: Not on file  Social Connections: Not on file  Intimate Partner Violence: Not on file    Current Outpatient Medications on File Prior to Visit  Medication Sig Dispense Refill   ibuprofen (ADVIL) 600  MG tablet Take 1 tablet (600 mg total) by mouth every 6 (six) hours as needed. 60 tablet 0   nitrofurantoin, macrocrystal-monohydrate, (MACROBID) 100 MG capsule Take 1 capsule (100 mg total) by mouth 2 (two) times daily. 10 capsule 0   prenatal vitamin w/FE, FA (PRENATAL 1 + 1) 27-1 MG TABS tablet Take 1 tablet by mouth daily at 12 noon.     Probiotic Product (PROBIOTIC ADVANCED PO) Take by mouth.     No current facility-administered medications on file prior to visit.    Allergies  Allergen Reactions   Sulfa Antibiotics      Review of Systems Constitutional: negative for chills, fatigue, fevers and sweats Eyes: negative for irritation, redness and visual disturbance Ears, nose, mouth, throat, and face: negative for hearing loss, nasal congestion, snoring and tinnitus Respiratory: negative for asthma, cough, sputum Cardiovascular: negative for chest pain, dyspnea, exertional chest pressure/discomfort, irregular heart beat, palpitations and  syncope Gastrointestinal: negative for abdominal pain, change in bowel habits, nausea and vomiting Genitourinary: negative for abnormal menstrual periods, genital lesions, sexual problems and vaginal discharge, dysuria and urinary incontinence Integument/breast: negative for breast lump, breast tenderness and nipple discharge Hematologic/lymphatic: negative for bleeding and easy bruising Musculoskeletal:negative for back pain and muscle weakness Neurological: negative for dizziness, headaches, vertigo and weakness Endocrine: negative for diabetic symptoms including polydipsia, polyuria and skin dryness Allergic/Immunologic: negative for hay fever and urticaria      Objective:   Body mass index is 26.45 kg/m. Blood pressure 104/81, pulse (!) 103, height 5\' 2"  (1.575 m), weight 144 lb 9.6 oz (65.6 kg), last menstrual period 05/23/2022.    General Appearance:    Alert, cooperative, no distress, appears stated age  Head:    Normocephalic, without obvious abnormality, atraumatic  Eyes:    PERRL, conjunctiva/corneas clear, EOM's intact, both eyes  Ears:    Normal external ear canals, both ears  Nose:   Nares normal, septum midline, mucosa normal, no drainage or sinus tenderness  Throat:   Lips, mucosa, and tongue normal; teeth and gums normal  Neck:   Supple, symmetrical, trachea midline, no adenopathy; thyroid: no enlargement/tenderness/nodules; no carotid bruit or JVD  Back:     Symmetric, no curvature, ROM normal, no CVA tenderness  Lungs:     Clear to auscultation bilaterally, respirations unlabored  Chest Wall:    No tenderness or deformity   Heart:    Regular rate and rhythm, S1 and S2 normal, no murmur, rub or gallop  Breast Exam:    No tenderness, masses, or nipple abnormality  Abdomen:     Soft, non-tender, bowel sounds active all four quadrants, no masses, no organomegaly.    Genitalia:    Pelvic:external genitalia normal, vagina without lesions, discharge, or tenderness,  rectovaginal septum  normal. Cervix normal in appearance, no cervical motion tenderness, no adnexal masses or tenderness.  Uterus normal size, shape, mobile, regular contours, nontender.  Rectal:    Normal external sphincter.  No hemorrhoids appreciated. Internal exam not done.   Extremities:   Extremities normal, atraumatic, no cyanosis or edema  Pulses:   2+ and symmetric all extremities  Skin:   Skin color, texture, turgor normal, no rashes or lesions  Lymph nodes:   Cervical, supraclavicular, and axillary nodes normal  Neurologic:   CNII-XII intact, normal strength, sensation and reflexes throughout   .  Labs:  Lab Results  Component Value Date   WBC 3.8 05/29/2021   HGB 14.4 05/29/2021   HCT 41.3 05/29/2021  MCV 94 05/29/2021   PLT 236 05/29/2021    Lab Results  Component Value Date   CREATININE 0.68 05/29/2021   BUN 10 05/29/2021   NA 143 05/29/2021   K 3.8 05/29/2021   CL 103 05/29/2021   CO2 26 05/29/2021    Lab Results  Component Value Date   ALT 15 05/29/2021   AST 15 05/29/2021   ALKPHOS 109 05/29/2021   BILITOT 0.6 05/29/2021    Lab Results  Component Value Date   TSH 2.410 01/20/2020     Assessment:   1. Encounter for well woman exam with routine gynecological exam   2. Lactating mother      Plan:  Blood tests: UTD by PCP. Breast self exam technique reviewed and patient encouraged to perform self-exam monthly. Contraception: vasectomy. Discussed healthy lifestyle modifications. Mammogram  :Not age appropriate. To begin screens at age 34.  Pap smear  UTD .  Due in 1 year.  Follow up in 1 year for annual exam   Hildred Laser, MD [facility]

## 2022-06-11 ENCOUNTER — Encounter: Payer: Self-pay | Admitting: Obstetrics and Gynecology

## 2022-06-11 ENCOUNTER — Ambulatory Visit (INDEPENDENT_AMBULATORY_CARE_PROVIDER_SITE_OTHER): Payer: BC Managed Care – PPO | Admitting: Obstetrics and Gynecology

## 2022-06-11 VITALS — BP 104/81 | HR 103 | Ht 62.0 in | Wt 144.6 lb

## 2022-06-11 DIAGNOSIS — Z01419 Encounter for gynecological examination (general) (routine) without abnormal findings: Secondary | ICD-10-CM

## 2022-06-11 NOTE — Patient Instructions (Addendum)
Breast Self-Awareness Breast self-awareness is knowing how your breasts look and feel. You need to: Check your breasts on a regular basis. Tell your doctor about any changes. Become familiar with the look and feel of your breasts. This can help you catch a breast problem while it is still small and can be treated. You should do breast self-exams even if you have breast implants. What you need: A mirror. A well-lit room. A pillow or other soft object. How to do a breast self-exam Follow these steps to do a breast self-exam: Look for changes  Take off all the clothes above your waist. Stand in front of a mirror in a room with good lighting. Put your hands down at your sides. Compare your breasts in the mirror. Look for any difference between them, such as: A difference in shape. A difference in size. Wrinkles, dips, and bumps in one breast and not the other. Look at each breast for changes in the skin, such as: Redness. Scaly areas. Skin that has gotten thicker. Dimpling. Open sores (ulcers). Look for changes in your nipples, such as: Fluid coming out of a nipple. Fluid around a nipple. Bleeding. Dimpling. Redness. A nipple that looks pushed in (retracted), or that has changed position. Feel for changes Lie on your back. Feel each breast. To do this: Pick a breast to feel. Place a pillow under the shoulder closest to that breast. Put the arm closest to that breast behind your head. Feel the nipple area of that breast using the hand of your other arm. Feel the area with the pads of your three middle fingers by making small circles with your fingers. Use light, medium, and firm pressure. Continue the overlapping circles, moving downward over the breast. Keep making circles with your fingers. Stop when you feel your ribs. Start making circles with your fingers again, this time going upward until you reach your collarbone. Then, make circles outward across your breast and into your  armpit area. Squeeze your nipple. Check for discharge and lumps. Repeat these steps to check your other breast. Sit or stand in the tub or shower. With soapy water on your skin, feel each breast the same way you did when you were lying down. Write down what you find Writing down what you find can help you remember what to tell your doctor. Write down: What is normal for each breast. Any changes you find in each breast. These include: The kind of changes you find. A tender or painful breast. Any lump you find. Write down its size and where it is. When you last had your monthly period (menstrual cycle). General tips If you are breastfeeding, the best time to check your breasts is after you feed your baby or after you use a breast pump. If you get monthly bleeding, the best time to check your breasts is 5-7 days after your monthly cycle ends. With time, you will become comfortable with the self-exam. You will also start to know if there are changes in your breasts. Contact a doctor if: You see a change in the shape or size of your breasts or nipples. You see a change in the skin of your breast or nipples, such as red or scaly skin. You have fluid coming from your nipples that is not normal. You find a new lump or thick area. You have breast pain. You have any concerns about your breast health. Summary Breast self-awareness includes looking for changes in your breasts and feeling for changes   within your breasts. You should do breast self-awareness in front of a mirror in a well-lit room. If you get monthly periods (menstrual cycles), the best time to check your breasts is 5-7 days after your period ends. Tell your doctor about any changes you see in your breasts. Changes include changes in size, changes on the skin, painful or tender breasts, or fluid from your nipples that is not normal. This information is not intended to replace advice given to you by your health care provider. Make sure  you discuss any questions you have with your health care provider. Document Revised: 06/21/2021 Document Reviewed: 11/16/2020 Elsevier Patient Education  2023 [employer] Preventive Care 21-39 Years Old, Female Preventive care refers to lifestyle choices and visits with your health care provider that can promote health and wellness. Preventive care visits are also called wellness exams. What can I expect for my preventive care visit? Counseling During your preventive care visit, your health care provider may ask about your: Medical history, including: Past medical problems. Family medical history. Pregnancy history. Current health, including: Menstrual cycle. Method of birth control. Emotional well-being. Home life and relationship well-being. Sexual activity and sexual health. Lifestyle, including: Alcohol, nicotine or tobacco, and drug use. Access to firearms. Diet, exercise, and sleep habits. Work and work environment. Sunscreen use. Safety issues such as seatbelt and bike helmet use. Physical exam Your health care provider may check your: Height and weight. These may be used to calculate your BMI (body mass index). BMI is a measurement that tells if you are at a healthy weight. Waist circumference. This measures the distance around your waistline. This measurement also tells if you are at a healthy weight and may help predict your risk of certain diseases, such as type 2 diabetes and high blood pressure. Heart rate and blood pressure. Body temperature. Skin for abnormal spots. What immunizations do I need?  Vaccines are usually given at various ages, according to a schedule. Your health care provider will recommend vaccines for you based on your age, medical history, and lifestyle or other factors, such as travel or where you work. What tests do I need? Screening Your health care provider may recommend screening tests for certain conditions. This may include: Pelvic exam  and Pap test. Lipid and cholesterol levels. Diabetes screening. This is done by checking your blood sugar (glucose) after you have not eaten for a while (fasting). Hepatitis B test. Hepatitis C test. HIV (human immunodeficiency virus) test. STI (sexually transmitted infection) testing, if you are at risk. BRCA-related cancer screening. This may be done if you have a family history of breast, ovarian, tubal, or peritoneal cancers. Talk with your health care provider about your test results, treatment options, and if necessary, the need for more tests. Follow these instructions at home: Eating and drinking  Eat a healthy diet that includes fresh fruits and vegetables, whole grains, lean protein, and low-fat dairy products. Take vitamin and mineral supplements as recommended by your health care provider. Do not drink alcohol if: Your health care provider tells you not to drink. You are pregnant, may be pregnant, or are planning to become pregnant. If you drink alcohol: Limit how much you have to 0-1 drink a day. Know how much alcohol is in your drink. In the [place] one drink equals one 12 oz bottle of beer (355 mL), one 5 oz glass of wine (148 mL), or one 1 oz glass of hard liquor (44 mL). Lifestyle Brush your teeth every morning and   night with fluoride toothpaste. Floss one time each day. Exercise for at least 30 minutes 5 or more days each week. Do not use any products that contain nicotine or tobacco. These products include cigarettes, chewing tobacco, and vaping devices, such as e-cigarettes. If you need help quitting, ask your health care provider. Do not use drugs. If you are sexually active, practice safe sex. Use a condom or other form of protection to prevent STIs. If you do not wish to become pregnant, use a form of birth control. If you plan to become pregnant, see your health care provider for a prepregnancy visit. Find healthy ways to manage stress, such as: Meditation, yoga, or  listening to music. Journaling. Talking to a trusted person. Spending time with friends and family. Minimize exposure to UV radiation to reduce your risk of skin cancer. Safety Always wear your seat belt while driving or riding in a vehicle. Do not drive: If you have been drinking alcohol. Do not ride with someone who has been drinking. If you have been using any mind-altering substances or drugs. While texting. When you are tired or distracted. Wear a helmet and other protective equipment during sports activities. If you have firearms in your house, make sure you follow all gun safety procedures. Seek help if you have been physically or sexually abused. What's next? Go to your health care provider once a year for an annual wellness visit. Ask your health care provider how often you should have your eyes and teeth checked. Stay up to date on all vaccines. This information is not intended to replace advice given to you by your health care provider. Make sure you discuss any questions you have with your health care provider. Document Revised: 07/12/2020 Document Reviewed: 07/12/2020 Elsevier Patient Education  2023 [employer]  

## 2022-10-08 DIAGNOSIS — D225 Melanocytic nevi of trunk: Secondary | ICD-10-CM | POA: Diagnosis not present

## 2022-10-08 DIAGNOSIS — Z85820 Personal history of malignant melanoma of skin: Secondary | ICD-10-CM | POA: Diagnosis not present

## 2022-10-08 DIAGNOSIS — L814 Other melanin hyperpigmentation: Secondary | ICD-10-CM | POA: Diagnosis not present

## 2022-10-08 DIAGNOSIS — Z872 Personal history of diseases of the skin and subcutaneous tissue: Secondary | ICD-10-CM | POA: Diagnosis not present
